# Patient Record
Sex: Male | Born: 1975 | Race: Asian | Hispanic: No | Marital: Married | Smoking: Never smoker
Health system: Southern US, Community
[De-identification: ages and names within clinical notes are randomized; demographics above are authoritative.]

## PROBLEM LIST (undated history)

## (undated) DIAGNOSIS — R778 Other specified abnormalities of plasma proteins: Secondary | ICD-10-CM

## (undated) DIAGNOSIS — R0789 Other chest pain: Secondary | ICD-10-CM

## (undated) DIAGNOSIS — R7989 Other specified abnormal findings of blood chemistry: Secondary | ICD-10-CM

## (undated) DIAGNOSIS — I10 Essential (primary) hypertension: Secondary | ICD-10-CM

## (undated) DIAGNOSIS — I422 Other hypertrophic cardiomyopathy: Principal | ICD-10-CM

## (undated) HISTORY — PX: OTHER SURGICAL HISTORY: SHX169

## (undated) HISTORY — DX: Other chest pain: R07.89

## (undated) HISTORY — DX: Other specified abnormal findings of blood chemistry: R79.89

## (undated) HISTORY — DX: Other hypertrophic cardiomyopathy: I42.2

## (undated) HISTORY — DX: Other specified abnormalities of plasma proteins: R77.8

---

## 2017-02-04 ENCOUNTER — Encounter (HOSPITAL_BASED_OUTPATIENT_CLINIC_OR_DEPARTMENT_OTHER): Payer: Self-pay | Admitting: Emergency Medicine

## 2017-02-04 ENCOUNTER — Emergency Department (HOSPITAL_BASED_OUTPATIENT_CLINIC_OR_DEPARTMENT_OTHER)
Admission: EM | Admit: 2017-02-04 | Discharge: 2017-02-04 | Disposition: A | Discharge status: Home / Self Care | Payer: 59 | Attending: Physician Assistant | Admitting: Physician Assistant

## 2017-02-04 DIAGNOSIS — W540XXA Bitten by dog, initial encounter: Secondary | ICD-10-CM | POA: Insufficient documentation

## 2017-02-04 DIAGNOSIS — Y999 Unspecified external cause status: Secondary | ICD-10-CM | POA: Insufficient documentation

## 2017-02-04 DIAGNOSIS — Y9389 Activity, other specified: Secondary | ICD-10-CM | POA: Insufficient documentation

## 2017-02-04 DIAGNOSIS — Y9289 Other specified places as the place of occurrence of the external cause: Secondary | ICD-10-CM | POA: Diagnosis not present

## 2017-02-04 DIAGNOSIS — S71152A Open bite, left thigh, initial encounter: Secondary | ICD-10-CM | POA: Insufficient documentation

## 2017-02-04 MED ORDER — BACITRACIN ZINC 500 UNIT/GM EX OINT
TOPICAL_OINTMENT | Freq: Two times a day (BID) | CUTANEOUS | Status: DC
  Administered 2017-02-04: 22:00:00 via TOPICAL
  Filled 2017-02-04: qty 28.35

## 2017-02-04 MED ORDER — AMOXICILLIN-POT CLAVULANATE 875-125 MG PO TABS
1.0000 | ORAL_TABLET | Freq: Once | ORAL | Status: AC
  Administered 2017-02-04: 1 via ORAL
  Filled 2017-02-04: qty 1

## 2017-02-04 MED ORDER — AMOXICILLIN-POT CLAVULANATE 875-125 MG PO TABS: 1.0000 | ORAL_TABLET | Freq: Two times a day (BID) | ORAL | 0 refills | Status: DC

## 2017-02-04 NOTE — Discharge Instructions (Signed)
Please retuen with any concerns.

## 2017-02-04 NOTE — ED Triage Notes (Signed)
Patient states that he was walking in the neighborhood dog. The patient reports that he was bit by the dog to his left thigh  - patient denies any pain, dogs shots were up to date

## 2017-02-04 NOTE — ED Provider Notes (Signed)
[facility]   CSN: [id number] Arrival date & time: 02/04/17  2002     History   Chief Complaint Chief Complaint  Patient presents with  . Animal Bite    HPI Patrick Santana is a 41 y.o. male.  HPI   Patient is a 41 year old male presenting with bite to left thigh. Patient has dog records and.is up-to-date. Appear to be 3 small marks, abrasion versus bite. Nothing to repair. Patient otherwise appears baseline.  History reviewed. No pertinent past medical history.  There are no active problems to display for this patient.   History reviewed. No pertinent surgical history.     Home Medications    Prior to Admission medications   Not on File    Family History History reviewed. No pertinent family history.  Social History Social History  Substance Use Topics  . Smoking status: Never Smoker  . Smokeless tobacco: Never Used  . Alcohol use Yes     Allergies   Patient has no known allergies.   Review of Systems Review of Systems  Respiratory: Negative for shortness of breath.   Cardiovascular: Negative for chest pain.  Neurological: Negative for weakness.  All other systems reviewed and are negative.    Physical Exam Updated Vital Signs BP (!) 145/103 (BP Location: Left Arm)   Pulse 80   Temp 98 F (36.7 C) (Oral)   Resp 20   Ht 5\' 10"  (1.778 m)   Wt 73 kg (160 lb 15 oz)   SpO2 98%   BMI 23.09 kg/m   Physical Exam  Constitutional: He is oriented to person, place, and time. He appears well-nourished.  HENT:  Head: Normocephalic.  Mouth/Throat: Oropharynx is clear and moist.  Eyes: Conjunctivae are normal.  Cardiovascular: Normal rate.   Pulmonary/Chest: Effort normal. No respiratory distress.  Musculoskeletal: Normal range of motion.  3 small abrasions/puncture wounds to the left thigh. No need for repair. No surrounding erythema.  Neurological: He is oriented to person, place, and time.  Skin: Skin is warm  and dry. No rash noted. He is not diaphoretic.  Psychiatric: He has a normal mood and affect. His behavior is normal.  Nursing note and vitals reviewed.    ED Treatments / Results  Labs (all labs ordered are listed, but only abnormal results are displayed) Labs Reviewed - No data to display  EKG  EKG Interpretation None       Radiology No results found.  Procedures Procedures (including critical care time)  Medications Ordered in ED Medications  amoxicillin-clavulanate (AUGMENTIN) 875-125 MG per tablet 1 tablet (not administered)  bacitracin ointment (not administered)     Initial Impression / Assessment and Plan / ED Course  I have reviewed the triage vital signs and the nursing notes.  Pertinent labs & imaging results that were available during my care of the patient were reviewed by me and considered in my medical decision making (see chart for details).     Patient is a 41 year old male presenting with bite to left thigh. Patient has dog records and.is up-to-date. Appear to be 3 small marks, abrasion versus bite. Nothing to repair. Patient otherwise appears baseline. We'll choose Augmentin. We'll have patient soak in bath tub when he gets home and then put on bacitracin. Neosporin on the abrasions. Return precautions expressed.  Final Clinical Impressions(s) / ED Diagnoses   Final diagnoses:  None    New Prescriptions New Prescriptions   No medications on file  Abelino DerrickMackuen, Shawanda Sievert Lyn, MD 02/04/17 2159

## 2017-08-24 DIAGNOSIS — Z3141 Encounter for fertility testing: Secondary | ICD-10-CM | POA: Diagnosis not present

## 2017-10-27 DIAGNOSIS — Z3141 Encounter for fertility testing: Secondary | ICD-10-CM | POA: Diagnosis not present

## 2017-11-21 DIAGNOSIS — Z3141 Encounter for fertility testing: Secondary | ICD-10-CM | POA: Diagnosis not present

## 2017-12-06 DIAGNOSIS — Z3141 Encounter for fertility testing: Secondary | ICD-10-CM | POA: Diagnosis not present

## 2018-01-29 ENCOUNTER — Other Ambulatory Visit: Payer: Self-pay

## 2018-01-29 ENCOUNTER — Encounter (HOSPITAL_BASED_OUTPATIENT_CLINIC_OR_DEPARTMENT_OTHER): Payer: Self-pay | Admitting: Emergency Medicine

## 2018-01-29 ENCOUNTER — Observation Stay (HOSPITAL_BASED_OUTPATIENT_CLINIC_OR_DEPARTMENT_OTHER)
Admission: EM | Admit: 2018-01-29 | Discharge: 2018-02-01 | Disposition: A | Discharge status: Home / Self Care | Payer: 59 | Attending: Internal Medicine | Admitting: Internal Medicine

## 2018-01-29 ENCOUNTER — Emergency Department (HOSPITAL_BASED_OUTPATIENT_CLINIC_OR_DEPARTMENT_OTHER): Payer: 59

## 2018-01-29 DIAGNOSIS — Z79899 Other long term (current) drug therapy: Secondary | ICD-10-CM | POA: Insufficient documentation

## 2018-01-29 DIAGNOSIS — I517 Cardiomegaly: Secondary | ICD-10-CM | POA: Diagnosis not present

## 2018-01-29 DIAGNOSIS — R200 Anesthesia of skin: Secondary | ICD-10-CM | POA: Diagnosis present

## 2018-01-29 DIAGNOSIS — M542 Cervicalgia: Secondary | ICD-10-CM | POA: Insufficient documentation

## 2018-01-29 DIAGNOSIS — R0789 Other chest pain: Secondary | ICD-10-CM | POA: Diagnosis not present

## 2018-01-29 DIAGNOSIS — R778 Other specified abnormalities of plasma proteins: Secondary | ICD-10-CM | POA: Diagnosis present

## 2018-01-29 DIAGNOSIS — I10 Essential (primary) hypertension: Secondary | ICD-10-CM | POA: Diagnosis present

## 2018-01-29 DIAGNOSIS — R079 Chest pain, unspecified: Secondary | ICD-10-CM | POA: Diagnosis present

## 2018-01-29 DIAGNOSIS — E785 Hyperlipidemia, unspecified: Secondary | ICD-10-CM | POA: Diagnosis not present

## 2018-01-29 DIAGNOSIS — I160 Hypertensive urgency: Secondary | ICD-10-CM | POA: Diagnosis not present

## 2018-01-29 DIAGNOSIS — R7989 Other specified abnormal findings of blood chemistry: Secondary | ICD-10-CM | POA: Diagnosis present

## 2018-01-29 DIAGNOSIS — E876 Hypokalemia: Secondary | ICD-10-CM | POA: Diagnosis present

## 2018-01-29 HISTORY — DX: Essential (primary) hypertension: I10

## 2018-01-29 LAB — BASIC METABOLIC PANEL
ANION GAP: 11 (ref 5–15)
BUN: 10 mg/dL (ref 6–20)
CHLORIDE: 100 mmol/L (ref 98–111)
CO2: 26 mmol/L (ref 22–32)
Calcium: 9.3 mg/dL (ref 8.9–10.3)
Creatinine, Ser: 0.92 mg/dL (ref 0.61–1.24)
GFR calc Af Amer: >60 mL/min (ref >60)
GLUCOSE: 119 mg/dL — AB (ref 70–99)
Potassium: 3.1 mmol/L — ABNORMAL LOW (ref 3.5–5.1)
Sodium: 137 mmol/L (ref 135–145)

## 2018-01-29 LAB — TROPONIN I: TROPONIN I: 0.03 ng/mL — AB (ref <0.03)

## 2018-01-29 LAB — CBC WITH DIFFERENTIAL/PLATELET
BASOS ABS: 0 10*3/uL (ref 0.0–0.1)
BASOS PCT: 0 %
Eosinophils Absolute: 0 10*3/uL (ref 0.0–0.7)
Eosinophils Relative: 0 %
HEMATOCRIT: 41 % (ref 39.0–52.0)
Hemoglobin: 14.6 g/dL (ref 13.0–17.0)
LYMPHS PCT: 21 %
Lymphs Abs: 1.9 10*3/uL (ref 0.7–4.0)
MCH: 34 pg (ref 26.0–34.0)
MCHC: 35.6 g/dL (ref 30.0–36.0)
MCV: 95.3 fL (ref 78.0–100.0)
MONOS PCT: 7 %
Monocytes Absolute: 0.6 10*3/uL (ref 0.1–1.0)
Neutro Abs: 6.4 10*3/uL (ref 1.7–7.7)
Neutrophils Relative %: 72 %
Platelets: 236 10*3/uL (ref 150–400)
RBC: 4.3 MIL/uL (ref 4.22–5.81)
RDW: 12.7 % (ref 11.5–15.5)
WBC: 8.9 10*3/uL (ref 4.0–10.5)

## 2018-01-29 MED ORDER — POTASSIUM CHLORIDE CRYS ER 20 MEQ PO TBCR
40.0000 meq | EXTENDED_RELEASE_TABLET | Freq: Once | ORAL | Status: AC
  Administered 2018-01-29: 40 meq via ORAL
  Filled 2018-01-29: qty 2

## 2018-01-29 MED ORDER — NITROGLYCERIN 0.4 MG SL SUBL
0.4000 mg | SUBLINGUAL_TABLET | SUBLINGUAL | Status: DC | PRN
  Administered 2018-01-29 (×2): 0.4 mg via SUBLINGUAL
  Filled 2018-01-29: qty 1

## 2018-01-29 MED ORDER — MORPHINE SULFATE (PF) 2 MG/ML IV SOLN: 2.0000 mg | INTRAVENOUS | Status: DC | PRN

## 2018-01-29 NOTE — ED Triage Notes (Signed)
Pt c/o 5/10 left side cp with numbness on his left arm, denies any nausea, vomiting or dizziness for the past 2 days.

## 2018-01-29 NOTE — ED Notes (Signed)
Family at bedside. 

## 2018-01-29 NOTE — ED Provider Notes (Signed)
[facility] EMERGENCY DEPARTMENT Provider Note   CSN: [id number] Arrival date & time: 01/29/18  2015     History   Chief Complaint Chief Complaint  Patient presents with  . Chest Pain    HPI Patrick Santana is a 42 y.o. male.  HPI  42 year old male presents with chest pain and left arm numbness.  Patient states that he started having the symptoms yesterday and they seem to be coming and going.  At first he will have a little bit of tingling in his left upper arm with some chest pressure to his left chest.  Comes and goes and never seem to last more than an hour.  Often happens at rest and he has not noticed it while exerting himself.  He went on a hike yesterday and it felt fine.  Seems to still be coming and going and so he went to urgent care to get it checked out.  There his blood pressure was noted to be high and he was told to come to the ER.  The numbness also started coming back.  He is having currently about a 2 out of 10 left-sided chest pressure.  There is no shortness of breath, dizziness, headache, or weakness in his extremities.  No nausea or vomiting.  He denies any known medical problems.  He states he did see a urologist a few months ago and was told his blood pressure was elevated then it was about 160 systolic.  History reviewed. No pertinent past medical history.  Patient Active Problem List   Diagnosis Date Noted  . Chest pain 01/29/2018  . Elevated troponin 01/29/2018  . Hypokalemia 01/29/2018  . Hypertensive urgency 01/29/2018    History reviewed. No pertinent surgical history.      Home Medications    Prior to Admission medications   Medication Sig Start Date End Date Taking? Authorizing Provider  amoxicillin-clavulanate (AUGMENTIN) 875-125 MG tablet Take 1 tablet by mouth every 12 (twelve) hours. 02/04/17   Mackuen, Cindee Salt, MD    Family History History reviewed. No pertinent family history.  Social History Social History    Tobacco Use  . Smoking status: Never Smoker  . Smokeless tobacco: Never Used  Substance Use Topics  . Alcohol use: Yes  . Drug use: No     Allergies   Patient has no known allergies.   Review of Systems Review of Systems  Respiratory: Negative for shortness of breath.   Cardiovascular: Positive for chest pain.  Gastrointestinal: Negative for abdominal pain, nausea and vomiting.  Musculoskeletal: Negative for back pain.  Neurological: Positive for numbness. Negative for dizziness, weakness and headaches.  All other systems reviewed and are negative.    Physical Exam Updated Vital Signs BP (!) 159/108   Pulse 65   Temp 97.9 F (36.6 C) (Oral)   Resp 14   Ht 5' 10.08" (1.78 m)   Wt 70.3 kg (155 lb) Comment: weighed at urgent care  SpO2 100%   BMI 22.19 kg/m   Physical Exam  Constitutional: He is oriented to person, place, and time. He appears well-developed and well-nourished.  Non-toxic appearance. He does not appear ill. No distress.  HENT:  Head: Normocephalic and atraumatic.  Right Ear: External ear normal.  Left Ear: External ear normal.  Nose: Nose normal.  Eyes: Right eye exhibits no discharge. Left eye exhibits no discharge.  Neck: Neck supple.  Cardiovascular: Normal rate, regular rhythm and normal heart sounds.  Pulses:  Radial pulses are 2+ on the right side, and 1+ on the left side.  Pulmonary/Chest: Effort normal and breath sounds normal.  Abdominal: Soft. There is no tenderness.  Musculoskeletal: He exhibits no edema.  Neurological: He is alert and oriented to person, place, and time.  5/5 strength in all 4 extremities. Normal gross sensation, including to left arm  Skin: Skin is warm and dry.  Nursing note and vitals reviewed.    ED Treatments / Results  Labs (all labs ordered are listed, but only abnormal results are displayed) Labs Reviewed  BASIC METABOLIC PANEL - Abnormal; Notable for the following components:      Result Value    Potassium 3.1 (*)    Glucose, Bld 119 (*)    All other components within normal limits  TROPONIN I - Abnormal; Notable for the following components:   Troponin I 0.03 (*)    All other components within normal limits  CBC WITH DIFFERENTIAL/PLATELET    EKG EKG Interpretation  Date/Time:  Monday January 29 2018 20:23:32 EDT Ventricular Rate:  66 PR Interval:    QRS Duration: 90 QT Interval:  433 QTC Calculation: 454 R Axis:   56 Text Interpretation:  Sinus rhythm LVH with secondary repolarization abnormality Anterior ST elevation, probably due to LVH Baseline wander in lead(s) V2 No old tracing to compare Confirmed by Pricilla Loveless [phone]) on 01/29/2018 8:27:10 PM   EKG Interpretation  Date/Time:  Monday January 29 2018 21:11:44 EDT Ventricular Rate:  81 PR Interval:    QRS Duration: 88 QT Interval:  385 QTC Calculation: 447 R Axis:   51 Text Interpretation:  Sinus rhythm Consider left atrial enlargement LVH with secondary repolarization abnormality Anterior ST elevation, probably due to LVH Artifact Confirmed by Pricilla Loveless [phone]) on 01/29/2018 10:00:09 PM        Radiology Dg Chest 2 View  Result Date: 01/29/2018 CLINICAL DATA:  Left arm numbness with left-sided chest pain for the past 2 days. EXAM: CHEST - 2 VIEW COMPARISON:  None. FINDINGS: The heart size and mediastinal contours are within normal limits. Both lungs are clear. The visualized skeletal structures are unremarkable. IMPRESSION: No active cardiopulmonary disease. Electronically Signed   By: Tollie Eth M.D.   On: 01/29/2018 21:08    Procedures .Critical Care Performed by: Pricilla Loveless, MD Authorized by: Pricilla Loveless, MD   Critical care provider statement:    Critical care time (minutes):  30   Critical care time was exclusive of:  Separately billable procedures and treating other patients   Critical care was necessary to treat or prevent imminent or life-threatening deterioration of the following  conditions:  Cardiac failure   Critical care was time spent personally by me on the following activities:  Development of treatment plan with patient or surrogate, discussions with consultants, evaluation of patient's response to treatment, examination of patient, obtaining history from patient or surrogate, ordering and performing treatments and interventions, ordering and review of laboratory studies, ordering and review of radiographic studies, pulse oximetry, re-evaluation of patient's condition and review of old charts   (including critical care time)  Medications Ordered in ED Medications  nitroGLYCERIN (NITROSTAT) SL tablet 0.4 mg (0.4 mg Sublingual Given 01/29/18 2058)  morphine 2 MG/ML injection 2 mg (has no administration in time range)  potassium chloride SA (K-DUR,KLOR-CON) CR tablet 40 mEq (40 mEq Oral Given 01/29/18 2119)     Initial Impression / Assessment and Plan / ED Course  I have reviewed the triage vital signs  and the nursing notes.  Pertinent labs & imaging results that were available during my care of the patient were reviewed by me and considered in my medical decision making (see chart for details).  Clinical Course as of Jan 29 2353  Mon Jan 29, 2018  2042 Discussed with Dr. Carlynn HeraldWarriach. Concerning ECG, but probably has LVH component. Recommends serial ECGs and call back when first troponin is back. Do not call code STEMI at this time. Patient has already received ASA at urgent care   [SG]    Clinical Course User Index [SG] Pricilla LovelessGoldston, Braylyn Eye, MD    Patient is quite hypertensive on arrival but actually looks pretty good and has minimal chest pain.  ECG is quite abnormal but this is probably LVH.  I discussed with cardiology fellow who agrees but would like serial ECGs and to call back when troponin comes back.  He has previously been given 4 baby aspirin by urgent care just prior to arrival.  His blood pressure has improved with nitroglycerin and he now is pain-free.  ECGs  are unchanging.  Discussed with cardiology fellow again as well as Dr. Gala RomneyBensimhon who advises hospitalist admit and echo with cards consult in morning.  Continue to cycle troponins.  Discussed with Dr. Clyde LundborgNiu who will admit.  At this point cardiology asked to hold off on heparin until further troponins.  Troponin leak is minimal to the point that it could just be from hypertension and LVH. Has remained stable, transferred via [facility]link.  Final Clinical Impressions(s) / ED Diagnoses   Final diagnoses:  Atypical chest pain  Elevated troponin    ED Discharge Orders    None       Pricilla LovelessGoldston, Dakarai Mcglocklin, MD 01/29/18 2357

## 2018-01-29 NOTE — ED Notes (Signed)
ED Provider at bedside. 

## 2018-01-29 NOTE — ED Notes (Signed)
Date and time results received: 01/29/18 2008 (use smartphrase ".now" to insert current time)  Test: troponin Critical Value: 0.03  Name of Provider Notified: Dr. Criss AlvineGoldston  Orders Received? Or Actions Taken?: awaiting new orders.

## 2018-01-29 NOTE — ED Notes (Signed)
Report attempted x 1

## 2018-01-29 NOTE — Care Management (Signed)
This is a no charge note  [facility] Lawrence MarseillesGoldstone  42 year old male with past medical history of possible hypertension not medications, who presents with intermittent chest pain for 2 days.  He also has left arm numbness.  Patient was found to have positive troponin 0.03, EKG showed deep T wave inversion in lateral leads and in V3-V6, bifascicular T wave in aVF and V3. EDP dicussed with cardiology, Dr. Rosemary HolmsPatwardhan, who recommended to get 2D echo and cycle trop. Need to consult card in AM again.   WBC 8.9, troponin 0.03, potassium 3.1, creatinine normal, temperature normal, elevated blood pressure 208/115-->175/112, HRn 105-->76, RR 22-->76, oxygen saturation 100%, temperature normal, negative chest x-ray.  Patient is placed on telemetry bed for observation.  Please call manager of [facility] at [phone] when pt arrives to floor   Lorretta HarpXilin Fouad Taul, MD  [facility] Pager [phone]  If 7PM-7AM, please contact night-coverage [email or web address] Password [email or web address] 01/29/2018, 9:44 PM

## 2018-01-30 ENCOUNTER — Observation Stay (HOSPITAL_COMMUNITY): Payer: 59

## 2018-01-30 ENCOUNTER — Encounter (HOSPITAL_COMMUNITY): Payer: Self-pay | Admitting: Internal Medicine

## 2018-01-30 ENCOUNTER — Observation Stay (HOSPITAL_BASED_OUTPATIENT_CLINIC_OR_DEPARTMENT_OTHER): Payer: 59

## 2018-01-30 DIAGNOSIS — I160 Hypertensive urgency: Secondary | ICD-10-CM | POA: Diagnosis not present

## 2018-01-30 DIAGNOSIS — R748 Abnormal levels of other serum enzymes: Secondary | ICD-10-CM

## 2018-01-30 DIAGNOSIS — E876 Hypokalemia: Secondary | ICD-10-CM | POA: Diagnosis not present

## 2018-01-30 DIAGNOSIS — E785 Hyperlipidemia, unspecified: Secondary | ICD-10-CM | POA: Diagnosis not present

## 2018-01-30 DIAGNOSIS — R079 Chest pain, unspecified: Secondary | ICD-10-CM

## 2018-01-30 DIAGNOSIS — R200 Anesthesia of skin: Secondary | ICD-10-CM | POA: Diagnosis present

## 2018-01-30 DIAGNOSIS — M542 Cervicalgia: Secondary | ICD-10-CM | POA: Diagnosis not present

## 2018-01-30 DIAGNOSIS — I10 Essential (primary) hypertension: Secondary | ICD-10-CM | POA: Diagnosis not present

## 2018-01-30 DIAGNOSIS — I517 Cardiomegaly: Secondary | ICD-10-CM | POA: Diagnosis not present

## 2018-01-30 DIAGNOSIS — M50122 Cervical disc disorder at C5-C6 level with radiculopathy: Secondary | ICD-10-CM | POA: Diagnosis not present

## 2018-01-30 DIAGNOSIS — I422 Other hypertrophic cardiomyopathy: Secondary | ICD-10-CM | POA: Diagnosis not present

## 2018-01-30 LAB — TROPONIN I
Troponin I: <0.03 ng/mL (ref <0.03)
Troponin I: <0.03 ng/mL (ref <0.03)
Troponin I: <0.03 ng/mL (ref <0.03)

## 2018-01-30 LAB — HEMOGLOBIN A1C
HEMOGLOBIN A1C: 5.1 % (ref 4.8–5.6)
MEAN PLASMA GLUCOSE: 99.67 mg/dL

## 2018-01-30 LAB — RAPID URINE DRUG SCREEN, HOSP PERFORMED
AMPHETAMINES: NOT DETECTED
BARBITURATES: NOT DETECTED
BENZODIAZEPINES: NOT DETECTED
Cocaine: NOT DETECTED
Opiates: NOT DETECTED
Tetrahydrocannabinol: NOT DETECTED

## 2018-01-30 LAB — LIPID PANEL
Cholesterol: 265 mg/dL — ABNORMAL HIGH (ref 0–200)
HDL: 75 mg/dL (ref >40)
LDL CALC: 156 mg/dL — AB (ref 0–99)
Total CHOL/HDL Ratio: 3.5 RATIO
Triglycerides: 169 mg/dL — ABNORMAL HIGH (ref <150)
VLDL: 34 mg/dL (ref 0–40)

## 2018-01-30 LAB — MAGNESIUM: Magnesium: 2.1 mg/dL (ref 1.7–2.4)

## 2018-01-30 LAB — HIV ANTIBODY (ROUTINE TESTING W REFLEX): HIV SCREEN 4TH GENERATION: NONREACTIVE

## 2018-01-30 MED ORDER — ZOLPIDEM TARTRATE 5 MG PO TABS: 5.0000 mg | ORAL_TABLET | Freq: Every evening | ORAL | Status: DC | PRN

## 2018-01-30 MED ORDER — ATORVASTATIN CALCIUM 40 MG PO TABS
40.0000 mg | ORAL_TABLET | Freq: Every day | ORAL | Status: DC
  Administered 2018-01-30 – 2018-01-31 (×2): 40 mg via ORAL
  Filled 2018-01-30 (×2): qty 1

## 2018-01-30 MED ORDER — ASPIRIN EC 325 MG PO TBEC
325.0000 mg | DELAYED_RELEASE_TABLET | Freq: Every day | ORAL | Status: DC
  Administered 2018-01-30 – 2018-01-31 (×2): 325 mg via ORAL
  Filled 2018-01-30 (×2): qty 1

## 2018-01-30 MED ORDER — IOPAMIDOL (ISOVUE-370) INJECTION 76%
100.0000 mL | Freq: Once | INTRAVENOUS | Status: AC | PRN
  Administered 2018-01-30: 100 mL via INTRAVENOUS

## 2018-01-30 MED ORDER — ACETAMINOPHEN 325 MG PO TABS
650.0000 mg | ORAL_TABLET | Freq: Four times a day (QID) | ORAL | Status: DC | PRN
  Administered 2018-01-30: 650 mg via ORAL
  Filled 2018-01-30: qty 2

## 2018-01-30 MED ORDER — ONDANSETRON HCL 4 MG/2ML IJ SOLN: 4.0000 mg | Freq: Three times a day (TID) | INTRAMUSCULAR | Status: DC | PRN

## 2018-01-30 MED ORDER — AMLODIPINE BESYLATE 5 MG PO TABS
5.0000 mg | ORAL_TABLET | Freq: Every day | ORAL | Status: DC
  Administered 2018-01-30 – 2018-01-31 (×3): 5 mg via ORAL
  Filled 2018-01-30 (×3): qty 1

## 2018-01-30 MED ORDER — IOPAMIDOL (ISOVUE-370) INJECTION 76%
INTRAVENOUS | Status: AC
  Filled 2018-01-30: qty 100

## 2018-01-30 MED ORDER — HYDRALAZINE HCL 20 MG/ML IJ SOLN: 5.0000 mg | INTRAMUSCULAR | Status: DC | PRN

## 2018-01-30 MED ORDER — CARVEDILOL 12.5 MG PO TABS
12.5000 mg | ORAL_TABLET | Freq: Two times a day (BID) | ORAL | Status: DC
  Administered 2018-01-30 (×2): 12.5 mg via ORAL
  Filled 2018-01-30 (×2): qty 1

## 2018-01-30 MED ORDER — ENOXAPARIN SODIUM 40 MG/0.4ML ~~LOC~~ SOLN
40.0000 mg | SUBCUTANEOUS | Status: DC
  Administered 2018-01-30: 40 mg via SUBCUTANEOUS
  Filled 2018-01-30: qty 0.4

## 2018-01-30 NOTE — Consult Note (Addendum)
Cardiology Consultation:   Patient ID: Patrick Santana; [id number]; Aug 02, 1975   Admit date: 01/29/2018 Date of Consult: 01/30/2018  Primary Care Provider: Patient, No Pcp Per Primary Cardiologist: New   Patient Profile:   Patrick Santana is a 42 y.o. male with a hx of hypertension (not on medications) who is being seen today for the evaluation of chest pain at the request of Dr. Clyde LundborgNiu.  History of Present Illness:   Mr. Patrick Santana is a 42 year old male with a history stated above who initially presented to [facility] on [facility]/29/2018 with complaints of intermittent chest pain x2 days with associated left arm numbness. He reports prior to Sunday, he was in his usual state of health. On Sunday morning he began to feel left arm tingling with mild anterior chest pain rated a 2/10 in severity not associated with exertion. He proceeded with his wife to go on a hike and continued to feel the tingling and numbness however, it was tolerable and did not worsen with excercise. On Monday morning he went to work as usual and again began to feel his symptoms. Given the ongoing nature, he proceeded to an urgent care in which they performed an EKG and sent him to [facility] for further evaluation.  It was noted he was markedly hypertensive at the urgent care center with SBP initially in the 200s.  He denies palpitations, LE swelling, dyspnea, dizziness, headache, crushing/severe chest pain or syncope.  The pain is not worse with inspiration however, is somewhat worse with drinking water.  He does report that while in the ED his pain was relieved by SL NTG.  He has not had recurrent pain since admission.  He is resting comfortably in his room.  He denies tobacco, alcohol or illicit drug use.  He is no personal or family history of CAD.  He reports a family history of hypertension and diabetes.  He works as a Sport and exercise psychologistsoftware engineer.  In the ED, initial EKG performed revealed NSR with HR 66 and diffuse deep  T waves.  Initial i-STAT troponin 0.03 subsequent troponin I negative at <0.03.  Lipid panel revealed elevated LDL at 156.  Potassium was noted to be mildly low at 3.1.  CXR completed with no acute cardiopulmonary disease.  Significant abnormal EKG, cardiology was consulted for further management.  Past Medical History:  Diagnosis Date  . Essential hypertension     Past Surgical History:  Procedure Laterality Date  . left ear drum       Prior to Admission medications   Medication Sig Start Date End Date Taking? Authorizing Provider  amoxicillin-clavulanate (AUGMENTIN) 875-125 MG tablet Take 1 tablet by mouth every 12 (twelve) hours. 02/04/17   Mackuen, Cindee Saltourteney Lyn, MD    Inpatient Medications: Scheduled Meds: . amLODipine  5 mg Oral Daily  . aspirin EC  325 mg Oral Daily  . enoxaparin (LOVENOX) injection  40 mg Subcutaneous Q24H   Continuous Infusions:  PRN Meds: acetaminophen, hydrALAZINE, morphine injection, nitroGLYCERIN, ondansetron (ZOFRAN) IV, zolpidem  Allergies:   No Known Allergies  Social History:   Social History   Socioeconomic History  . Marital status: Married    Spouse name: Not on file  . Number of children: Not on file  . Years of education: Not on file  . Highest education level: Not on file  Occupational History  . Not on file  Social Needs  . Financial resource strain: Not on file  . Food insecurity:    Worry: Not  on file    Inability: Not on file  . Transportation needs:    Medical: Not on file    Non-medical: Not on file  Tobacco Use  . Smoking status: Never Smoker  . Smokeless tobacco: Never Used  Substance and Sexual Activity  . Alcohol use: Yes  . Drug use: No  . Sexual activity: Not on file  Lifestyle  . Physical activity:    Days per week: Not on file    Minutes per session: Not on file  . Stress: Not on file  Relationships  . Social connections:    Talks on phone: Not on file    Gets together: Not on file    Attends  religious service: Not on file    Active member of club or organization: Not on file    Attends meetings of clubs or organizations: Not on file    Relationship status: Not on file  . Intimate partner violence:    Fear of current or ex partner: Not on file    Emotionally abused: Not on file    Physically abused: Not on file    Forced sexual activity: Not on file  Other Topics Concern  . Not on file  Social History Narrative  . Not on file    Family History:   Family History  Problem Relation Age of Onset  . Diabetes Mellitus II Mother   . Diabetes Mellitus II Father    Family Status:  Family Status  Relation Name Status  . Mother  (Not Specified)  . Father  (Not Specified)    ROS:  Please see the history of present illness.  All other ROS reviewed and negative.     Physical Exam/Data:   Vitals:   01/30/18 0005 01/30/18 0016 01/30/18 0315 01/30/18 0508  BP:  (!) 160/118 (!) 154/108 (!) 152/109  Pulse:  72  (!) 59  Resp:    20  Temp:  97.8 F (36.6 C)  (!) 97.4 F (36.3 C)  TempSrc:  Oral  Oral  SpO2:  100%  100%  Weight: 153 lb (69.4 kg)     Height: 5\' 10"  (1.778 m)       Intake/Output Summary (Last 24 hours) at 01/30/2018 0744 Last data filed at 01/30/2018 0030 Gross per 24 hour  Intake -  Output 425 ml  Net -425 ml   Filed Weights   01/29/18 2018 01/30/18 0005  Weight: 155 lb (70.3 kg) 153 lb (69.4 kg)   Body mass index is 21.95 kg/m.   General: Well developed, well nourished, NAD Skin: Warm, dry, intact  Head: Normocephalic, atraumatic, clear, moist mucus membranes. Neck: Negative for carotid bruits. No JVD Lungs:Clear to ausculation bilaterally. No wheezes, rales, or rhonchi. Breathing is unlabored. Cardiovascular: RRR with S1 S2. No murmurs, rubs or gallops Abdomen: Soft, non-tender, non-distended with normoactive bowel sounds.  No obvious abdominal masses. MSK: Strength and tone appear normal for age. 5/5 in all extremities Extremities: No edema.  No clubbing or cyanosis. DP/PT pulses 2+ bilaterally Neuro: Alert and oriented. No focal deficits. No facial asymmetry. MAE spontaneously. Psych: Responds to questions appropriately with normal affect.    EKG:  The EKG was personally reviewed and demonstrates: 01/30/2018 NSR HR 67 with diffuse T wave inversions, evidence of LVH and questionable ST elevation in inferior leads Telemetry:  Telemetry was personally reviewed and demonstrates: Teen NSR HR 81  Relevant CV Studies:  ECHO: Pending  CATH: None  Laboratory Data:  Chemistry Recent Labs  Lab 01/29/18 2038  NA 137  K 3.1*  CL 100  CO2 26  GLUCOSE 119*  BUN 10  CREATININE 0.92  CALCIUM 9.3  GFRNONAA >60  GFRAA >60  ANIONGAP 11    No results found for: PROT, ALBUMIN, AST, ALT, ALKPHOS, BILITOT Hematology Recent Labs  Lab 01/29/18 2038  WBC 8.9  RBC 4.30  HGB 14.6  HCT 41.0  MCV 95.3  MCH 34.0  MCHC 35.6  RDW 12.7  PLT 236   Cardiac Enzymes Recent Labs  Lab 01/29/18 2038 01/30/18 0335  TROPONINI 0.03* <0.03   No results for input(s): TROPIPOC in the last 168 hours.  BNPNo results for input(s): BNP, PROBNP in the last 168 hours.  DDimer No results for input(s): DDIMER in the last 168 hours. TSH: No results found for: TSH Lipids: Lab Results  Component Value Date   CHOL 265 (H) 01/30/2018   HDL 75 01/30/2018   LDLCALC 156 (H) 01/30/2018   TRIG 169 (H) 01/30/2018   CHOLHDL 3.5 01/30/2018   HgbA1c: Lab Results  Component Value Date   HGBA1C 5.1 01/30/2018   Radiology/Studies:  Dg Chest 2 View  Result Date: 01/29/2018 CLINICAL DATA:  Left arm numbness with left-sided chest pain for the past 2 days. EXAM: CHEST - 2 VIEW COMPARISON:  None. FINDINGS: The heart size and mediastinal contours are within normal limits. Both lungs are clear. The visualized skeletal structures are unremarkable. IMPRESSION: No active cardiopulmonary disease. Electronically Signed   By: Tollie Eth M.D.   On: 01/29/2018 21:08    Assessment and Plan:   1.  Chest pain with abnormal EKG: -Patient presented from [facility] with abnormal EKG and complaints of intermittent, exertional left arm tingling with mild anterior chest pain with SL NTG (2/10).  He has no known known personal or family history of CAD.  Initial SBP noted to be markedly elevated in the 200s,which has now resolved to the 150s.  -Troponin,<0.03, continue to cycle enzymes monitor -EKG markedly abnormal with diffuse T wave inversions and new ST elevation in inferior leads -CXR negative for cardiopulmonary disease -Will obtain echocardiogram, STAT -CTA chest/aorta ordered STAT to rule out possible dissection given markedly elevated SBP on arrival and unknown duration of hypertension without control -NPO -Will likely need further ischemic evaluation depending on initial test results -ASA, BB, and will add statin   2.  Hypertension: -Elevated, 148/114, 152/109, 154/108, 160/118 -Initial BP on arrival, 208/115 -Continue amlodipine 5 mg -Will add to Carvedilol 12.5mg  twice daily and monitor BP  3.  Hypokalemia: -K+, 3.1>> replaced with 40 meq per primary team -Monitor with BMET   4. HLD: -Elevated LDL, 156 -Statin therapy with Lipitor 40mg  initially    For questions or updates, please contact [facility] Please consult [email or web address] for contact info under Cardiology/STEMI.   Raliegh Ip NP-C [facility] Pager: [phone] 01/30/2018 7:44 AM  Attending Note:   The patient was seen and examined.  Agree with assessment and plan as noted above.  Changes made to the above note as needed.  Patient seen and independently examined with  Georgie Chard, NP.   We discussed all aspects of the encounter. I agree with the assessment and plan as stated above.  1.  Chest discomfort: The patient's chest pains are somewhat atypical although are concerning for possibility of aortic dissection. He does not look like he is having  coronary ischemia. Upon her levels are negative.  He does have diffuse T wave inversions across  his EKG leads and this morning has developed a very mild amount of ST segment elevation in lead V3 and AVF.   His pain has now largely resolved.   CT angio of the chest has been completed and reveals no evidence of aortic dissection.  The CTA was normal.  He will need an ischemic work-up.  We will use the echocardiogram and CT angiogram for further information to decide how to work-up possibility of ischemic heart disease..   I have spent a total of 40 minutes with patient reviewing hospital  notes , telemetry, EKGs, labs and examining patient as well as establishing an assessment and plan that was discussed with the patient. > 50% of time was spent in direct patient care.    Vesta Mixer, Montez Hageman., MD, [facility] 01/30/2018, 10:09 AM [address]  [address] Office [phone] Pager [phone]

## 2018-01-30 NOTE — Progress Notes (Signed)
Patient admitted Dr Clyde LundborgNiu overnight for evaluation of chest pain concerning abnormal EKG.  His cardiac enzymes have remained negative, CT of the chest is negative for pulmonary embolism or dissection.  He still having off-and-on left-sided chest pain with radiation to his left arm.  He tells me he has been quite active and even played ping-pong at his work yesterday.  He denies any trauma but he did go hiking this last weekend. At this time his vital signs are stable. No new complaints.  Although his chest pain appears to be atypical we need to rule out any cardiac causes.  Plan to get an echocardiogram today and monitor him closely.  MRI cervical spine has also been ordered to rule out any radiculopathy.  Will determine further plan depending on the testing stated above.  Appreciate cardiology input.  Will closely monitor him today.  Please call with further questions  Patient's wife was present at bedside during my evaluation.  Stephania FragminAnkit Julya Alioto MD, TR[facility]

## 2018-01-30 NOTE — H&P (Signed)
History and Physical    Patrick Santana [id number] DOB: July 13, 1975 DOA: 01/29/2018  Referring MD/NP/PA:   PCP: Patient, No Pcp Per   Patient coming from:  The patient is coming from home.  At baseline, pt is independent for most of ADL.    Chief Complaint: left arm numbness and chest pain  HPI: Patrick Hertzmaresh Feltus is a [age] y.o. male with medical history significant of hypertension not on medications, who presents with chest pain, left arm numbness.  Patient states that he has been having intermittent left arm numbness in the past 2 days. It is involving the whole left arm in lateral side and whole left hand and all fingers. No left arm muscle weakness.  No facial droop, slurred speech, vision change or hearing loss.  Patient states that he has mild posterior neck pain, no neck injury.  He also reports intermittent chest pain, which is located in left side of the chest, initially 5 out of 10 severity, currently chest pain-free, sharp, nonradiating.  No tenderness in the calf areas. No shortness of breath, cough, fever or chills. Patient does not have nausea, vomiting, diarrhea, abdominal pain, symptoms of UTI.   ED Course: pt was found to have positive troponin 0.03, elevated blood pressure 208/115-->175/112, WBC 8.9, potassium 3.1, creatinine normal, temperature normal, negative chest x-ray.  Patient is placed on telemetry bed for observation.  Review of Systems:   General: no fevers, chills, no body weight gain, fatigue HEENT: no blurry vision, hearing changes or sore throat Respiratory: no dyspnea, coughing, wheezing CV: no chest pain, no palpitations GI: no nausea, vomiting, abdominal pain, diarrhea, constipation GU: no dysuria, burning on urination, increased urinary frequency, hematuria  Ext: no leg edema Neuro: has left arm numbness. No vision change or hearing loss Skin: no rash, no skin tear. MSK: No muscle spasm, no deformity, no limitation of range of movement in  spin Heme: No easy bruising.  Travel history: No recent long distant travel.  Allergy: No Known Allergies  Past Medical History:  Diagnosis Date  . Essential hypertension     Past Surgical History:  Procedure Laterality Date  . left ear drum      Social History:  reports that he has never smoked. He has never used smokeless tobacco. He reports that he drinks alcohol. He reports that he does not use drugs.  Family History:  Family History  Problem Relation Age of Onset  . Diabetes Mellitus II Mother   . Diabetes Mellitus II Father      Prior to Admission medications   Medication Sig Start Date End Date Taking? Authorizing Provider  amoxicillin-clavulanate (AUGMENTIN) 875-125 MG tablet Take 1 tablet by mouth every 12 (twelve) hours. 02/04/17   Abelino DerrickMackuen, Courteney Lyn, MD    Physical Exam: Vitals:   01/30/18 0005 01/30/18 0016 01/30/18 0315 01/30/18 0508  BP:  (!) 160/118 (!) 154/108 (!) 152/109  Pulse:  72  (!) 59  Resp:    20  Temp:  97.8 F (36.6 C)  (!) 97.4 F (36.3 C)  TempSrc:  Oral  Oral  SpO2:  100%  100%  Weight: 69.4 kg (153 lb)     Height: 5\' 10"  (1.778 m)      General: Not in acute distress HEENT:       Eyes: PERRL, EOMI, no scleral icterus.       ENT: No discharge from the ears and nose, no pharynx injection, no tonsillar enlargement.        Neck: No  JVD, no bruit, no mass felt. Heme: No neck lymph node enlargement. Cardiac: S1/S2, RRR, No murmurs, No gallops or rubs. Respiratory: No rales, wheezing, rhonchi or rubs. GI: Soft, nondistended, nontender, no rebound pain, no organomegaly, BS present. GU: No hematuria Ext: No pitting leg edema bilaterally. 2+DP/PT pulse bilaterally. Musculoskeletal: No joint deformities, No joint redness or warmth, no limitation of ROM in spin. Skin: No rashes.  Neuro: Alert, oriented X3, cranial nerves II-XII grossly intact, moves all extremities normally. Muscle strength 5/5 in all extremities, sensation to light touch  intact. Brachial reflex 2+ bilaterally. Negative Babinski's sign. Normal finger to nose test. Psych: Patient is not psychotic, no suicidal or hemocidal ideation.  Labs on Admission: I have personally reviewed following labs and imaging studies  CBC: Recent Labs  Lab 01/29/18 2038  WBC 8.9  NEUTROABS 6.4  HGB 14.6  HCT 41.0  MCV 95.3  PLT 236   Basic Metabolic Panel: Recent Labs  Lab 01/29/18 2038  NA 137  K 3.1*  CL 100  CO2 26  GLUCOSE 119*  BUN 10  CREATININE 0.92  CALCIUM 9.3   GFR: Estimated Creatinine Clearance: 104.8 mL/min (by C-G formula based on SCr of 0.92 mg/dL). Liver Function Tests: No results for input(s): AST, ALT, ALKPHOS, BILITOT, PROT, ALBUMIN in the last 168 hours. No results for input(s): LIPASE, AMYLASE in the last 168 hours. No results for input(s): AMMONIA in the last 168 hours. Coagulation Profile: No results for input(s): INR, PROTIME in the last 168 hours. Cardiac Enzymes: Recent Labs  Lab 01/29/18 2038 01/30/18 0335  TROPONINI 0.03* <0.03   BNP (last 3 results) No results for input(s): PROBNP in the last 8760 hours. HbA1C: Recent Labs    01/30/18 0335  HGBA1C 5.1   CBG: No results for input(s): GLUCAP in the last 168 hours. Lipid Profile: Recent Labs    01/30/18 0335  CHOL 265*  HDL 75  LDLCALC 156*  TRIG 169*  CHOLHDL 3.5   Thyroid Function Tests: No results for input(s): TSH, T4TOTAL, FREET4, T3FREE, THYROIDAB in the last 72 hours. Anemia Panel: No results for input(s): VITAMINB12, FOLATE, FERRITIN, TIBC, IRON, RETICCTPCT in the last 72 hours. Urine analysis: No results found for: COLORURINE, APPEARANCEUR, LABSPEC, PHURINE, GLUCOSEU, HGBUR, BILIRUBINUR, KETONESUR, PROTEINUR, UROBILINOGEN, NITRITE, LEUKOCYTESUR Sepsis Labs: @LABRCNTIP (procalcitonin:4,lacticidven:4) )No results found for this or any previous visit (from the past 240 hour(s)).   Radiological Exams on Admission: Dg Chest 2 View  Result Date:  01/29/2018 CLINICAL DATA:  Left arm numbness with left-sided chest pain for the past 2 days. EXAM: CHEST - 2 VIEW COMPARISON:  None. FINDINGS: The heart size and mediastinal contours are within normal limits. Both lungs are clear. The visualized skeletal structures are unremarkable. IMPRESSION: No active cardiopulmonary disease. Electronically Signed   By: Tollie Eth M.D.   On: 01/29/2018 21:08     EKG: Independently reviewed.  Sinus rhythm, QTC 467, LVH, deep T wave inversion in lateral leads, V3-V6, bifascicular T wave in aVF and V3   Assessment/Plan Principal Problem:   Chest pain Active Problems:   Elevated troponin   Hypokalemia   Hypertensive urgency   Left arm numbness   Chest pain and elevated trop: trop 0.03.  Most likely due to demand ischemia secondary to elevated blood pressure.  His chest pain was mild and has resolved along with improved blood pressure control.  -will place on tele bed for obs -Bp control -trop x 3 -check UDS, A1c and FLP -did not order 2d  echo. -ASA, prn NTG and prn morphine   Hypokalemia: K=3.1 on admission. - Repleted - Check Mg level  Hypertensive urgency:  bp 208/115-->175/112. Pt is not taking Bp meds at home -start amlodipine 5 mg daily -IV hydralazine as neededtart   Left arm numbness: Stroke, Etiology is not clear.  Patient does not have other signs of stroke.  No facial droop, slurred speech, vision change or hearing loss.  No muscle weakness.  low suspicions for stroke.  Patient reports neck pain, possibly due to C-spine nerve impingement. -Follow-up MRI of C-spine  DVT ppx: SQ Lovenox Code Status: Full code Family Communication: Yes, patient's wife at bed side Disposition Plan:  Anticipate discharge back to previous home environment Consults called:  none Admission status: Obs / tele    Date of Service 01/30/2018    Lorretta Harp [facility] Pager [phone]  If 7PM-7AM, please contact  night-coverage [email or web address] Password [email or web address] 01/30/2018, 8:11 AM

## 2018-01-30 NOTE — Progress Notes (Signed)
MD up to see patient. RN made MD aware of patient's BP on admission.

## 2018-01-30 NOTE — Progress Notes (Signed)
Charge nurse to page admitting that patient has arrived to floor.

## 2018-01-30 NOTE — Progress Notes (Signed)
Echocardiogram 2D Echocardiogram has been performed.  Pieter PartridgeBrooke S Lj Miyamoto 01/30/2018, 11:04 AM

## 2018-01-31 ENCOUNTER — Observation Stay (HOSPITAL_COMMUNITY): Payer: 59

## 2018-01-31 DIAGNOSIS — R0789 Other chest pain: Secondary | ICD-10-CM | POA: Diagnosis not present

## 2018-01-31 DIAGNOSIS — R748 Abnormal levels of other serum enzymes: Secondary | ICD-10-CM | POA: Diagnosis not present

## 2018-01-31 DIAGNOSIS — I422 Other hypertrophic cardiomyopathy: Secondary | ICD-10-CM | POA: Diagnosis not present

## 2018-01-31 DIAGNOSIS — I160 Hypertensive urgency: Secondary | ICD-10-CM | POA: Diagnosis not present

## 2018-01-31 DIAGNOSIS — R079 Chest pain, unspecified: Secondary | ICD-10-CM | POA: Diagnosis not present

## 2018-01-31 LAB — BASIC METABOLIC PANEL
Anion gap: 10 (ref 5–15)
BUN: 14 mg/dL (ref 6–20)
CHLORIDE: 100 mmol/L (ref 98–111)
CO2: 28 mmol/L (ref 22–32)
Calcium: 9 mg/dL (ref 8.9–10.3)
Creatinine, Ser: 0.98 mg/dL (ref 0.61–1.24)
GFR calc Af Amer: >60 mL/min (ref >60)
GFR calc non Af Amer: >60 mL/min (ref >60)
GLUCOSE: 109 mg/dL — AB (ref 70–99)
POTASSIUM: 3.8 mmol/L (ref 3.5–5.1)
SODIUM: 138 mmol/L (ref 135–145)

## 2018-01-31 LAB — ECHOCARDIOGRAM COMPLETE
Height: 70 in
Weight: 2448 oz

## 2018-01-31 MED ORDER — CARVEDILOL 25 MG PO TABS
25.0000 mg | ORAL_TABLET | Freq: Two times a day (BID) | ORAL | Status: DC
  Administered 2018-01-31 – 2018-02-01 (×3): 25 mg via ORAL
  Filled 2018-01-31 (×3): qty 1

## 2018-01-31 MED ORDER — GADOBENATE DIMEGLUMINE 529 MG/ML IV SOLN
25.0000 mL | Freq: Once | INTRAVENOUS | Status: AC
  Administered 2018-01-31: 24 mL via INTRAVENOUS

## 2018-01-31 MED ORDER — ASPIRIN EC 81 MG PO TBEC
81.0000 mg | DELAYED_RELEASE_TABLET | Freq: Every day | ORAL | Status: DC
  Administered 2018-02-01: 81 mg via ORAL
  Filled 2018-01-31: qty 1

## 2018-01-31 NOTE — Plan of Care (Signed)
Pt is without complaints of Chest pain or other discomfort. Has ambulated in halls without difficulty. Will be NPO after MN for stress test vs Cath Problem: Pain Managment: Goal: General experience of comfort will improve Outcome: Completed/Met

## 2018-01-31 NOTE — Progress Notes (Signed)
Progress Note  Patient Name: Patrick Santana Date of Encounter: 01/31/2018  Primary Cardiologist: New   Subjective   Pt feeling well today. Denies chest pain   Inpatient Medications    Scheduled Meds: . amLODipine  5 mg Oral Daily  . aspirin EC  325 mg Oral Daily  . atorvastatin  40 mg Oral q1800  . carvedilol  12.5 mg Oral BID WC  . enoxaparin (LOVENOX) injection  40 mg Subcutaneous Q24H   Continuous Infusions:  PRN Meds: acetaminophen, hydrALAZINE, morphine injection, nitroGLYCERIN, ondansetron (ZOFRAN) IV, zolpidem   Vital Signs    Vitals:   01/30/18 2055 01/31/18 0025 01/31/18 0410 01/31/18 0754  BP: (!) 140/94 116/66 (!) 132/96 (!) 155/90  Pulse: 74 61 (!) 59 68  Resp: 19 16 16 16   Temp: 98 F (36.7 C) 97.8 F (36.6 C) 97.8 F (36.6 C) 98.4 F (36.9 C)  TempSrc: Oral Oral Oral Oral  SpO2: 99% 100% 100% 100%  Weight:   152 lb 3.2 oz (69 kg)   Height:        Intake/Output Summary (Last 24 hours) at 01/31/2018 0810 Last data filed at 01/30/2018 0815 Gross per 24 hour  Intake 50 ml  Output -  Net 50 ml   Filed Weights   01/29/18 2018 01/30/18 0005 01/31/18 0410  Weight: 155 lb (70.3 kg) 153 lb (69.4 kg) 152 lb 3.2 oz (69 kg)   Physical Exam   General: Well developed, well nourished, NAD Skin: Warm, dry, intact  Head: Normocephalic, atraumatic, clear, moist mucus membranes. Neck: Negative for carotid bruits. No JVD Lungs:Clear to ausculation bilaterally. No wheezes, rales, or rhonchi. Breathing is unlabored. Cardiovascular: RRR with S1 S2. No murmurs, rubs or gallops Abdomen: Soft, non-tender, non-distended with normoactive bowel sounds. No obvious abdominal masses. MSK: Strength and tone appear normal for age. 5/5 in all extremities Extremities: No edema. No clubbing or cyanosis. DP/PT pulses 2+ bilaterally Neuro: Alert and oriented. No focal deficits. No facial asymmetry. MAE spontaneously. Psych: Responds to questions appropriately with  normal affect.    Labs    Chemistry Recent Labs  Lab 01/29/18 2038 01/31/18 0352  NA 137 138  K 3.1* 3.8  CL 100 100  CO2 26 28  GLUCOSE 119* 109*  BUN 10 14  CREATININE 0.92 0.98  CALCIUM 9.3 9.0  GFRNONAA >60 >60  GFRAA >60 >60  ANIONGAP 11 10     Hematology Recent Labs  Lab 01/29/18 2038  WBC 8.9  RBC 4.30  HGB 14.6  HCT 41.0  MCV 95.3  MCH 34.0  MCHC 35.6  RDW 12.7  PLT 236    Cardiac Enzymes Recent Labs  Lab 01/29/18 2038 01/30/18 0335 01/30/18 0818 01/30/18 1404  TROPONINI 0.03* <0.03 <0.03 <0.03   No results for input(s): TROPIPOC in the last 168 hours.   BNPNo results for input(s): BNP, PROBNP in the last 168 hours.   DDimer No results for input(s): DDIMER in the last 168 hours.   Radiology    Dg Chest 2 View  Result Date: 01/29/2018 CLINICAL DATA:  Left arm numbness with left-sided chest pain for the past 2 days. EXAM: CHEST - 2 VIEW COMPARISON:  None. FINDINGS: The heart size and mediastinal contours are within normal limits. Both lungs are clear. The visualized skeletal structures are unremarkable. IMPRESSION: No active cardiopulmonary disease. Electronically Signed   By: Tollie Eth M.D.   On: 01/29/2018 21:08   Mr Cervical Spine Wo Contrast  Result Date:  01/30/2018 CLINICAL DATA:  Radiculopathy. Pain in the left arm and left-sided chest pain. EXAM: MRI CERVICAL SPINE WITHOUT CONTRAST TECHNIQUE: Multiplanar, multisequence MR imaging of the cervical spine was performed. No intravenous contrast was administered. COMPARISON:  None. FINDINGS: Alignment: Slight straightening of the cervical lordosis. Vertebrae: No fracture, evidence of discitis, or bone lesion. Cord: Normal signal and morphology. Posterior Fossa, vertebral arteries, paraspinal tissues: Negative. Disc levels: C2-3 through C4-5: The discs are normal. No foraminal or spinal stenosis. C5-6: There is a small soft disc protrusion just to the right of midline extending into the right lateral  recess and proximal right neural foramen. No spinal cord compression. No significant foraminal stenosis. C6-7 and C7-T1: Normal. No appreciable facet arthritis in the cervical spine. Incidental note is made of a prominent epidural vein in the left lateral recesses extending from C1 through C5. This is felt to be an anatomic variant. IMPRESSION: 1. No evidence of left-sided neural impingement. 2. Small soft disc protrusion at C5-6 to the right which might affect the right C6 nerve. Electronically Signed   By: Francene Boyers M.D.   On: 01/30/2018 12:54   Ct Angio Chest Aorta W/cm &/or Wo/cm  Result Date: 01/30/2018 CLINICAL DATA:  Chest pain intermittently on LEFT rated at up to 5/10 in severity, LEFT arm numbness, history hypertension EXAM: CT ANGIOGRAPHY CHEST WITH CONTRAST TECHNIQUE: Multidetector CT imaging of the chest was performed using the standard protocol during bolus administration of intravenous contrast. Multiplanar CT image reconstructions and MIPs were obtained to evaluate the vascular anatomy. CONTRAST:  ISOVUE-370 IOPAMIDOL (ISOVUE-370) INJECTION 76% IV COMPARISON:  None FINDINGS: Cardiovascular: Aorta normal caliber without aneurysm or dissection. No atherosclerotic calcifications. Heart unremarkable. No pericardial effusion. Pulmonary arteries patent on non targeted exam without obvious filling defect. Mediastinum/Nodes: Base of cervical region normal appearance. Cough esophagus normal appearance. No thoracic adenopathy. Lungs/Pleura: Lungs clear. No infiltrate, pleural effusion, pneumothorax or mass. Upper Abdomen: Unremarkable Musculoskeletal: Normal appearance Review of the MIP images confirms the above findings. IMPRESSION: Normal CTA chest. Electronically Signed   By: Ulyses Southward M.D.   On: 01/30/2018 09:12   Telemetry    01/31/18 NSR with no acute changes - Personally Reviewed  ECG    No new tracing since 01/31/18- Personally Reviewed  Cardiac Studies   Echocardiogram  01/30/2018: Study Conclusions  - Left ventricle: The cavity size was normal. There was mild   concentric hypertrophy. Systolic function was vigorous. The   estimated ejection fraction was in the range of 65% to 70%. Wall   motion was normal; there were no regional wall motion   abnormalities. Doppler parameters are consistent with abnormal   left ventricular relaxation (grade 1 diastolic dysfunction). - Aortic valve: There was trivial regurgitation.  Patient Profile     42 y.o. male with a hx of hypertension (not on medications) who is being seen today for the evaluation of chest pain at the request of Dr. Clyde Lundborg.  Assessment & Plan    1.  Chest pain with abnormal EKG: -Patient presented from [facility] with abnormal EKG and complaints of intermittent, exertional left arm tingling with mild anterior chest pain with SL NTG (2/10). He has no known known personal or family history of CAD. Initial SBP noted to be markedly elevated in the 200s,which has now resolved to the 150s.  -Troponin,<0.03, <0.03, <0.03 -EKG markedly abnormal with diffuse T wave inversions and new ST elevation in inferior leads -CTA chest/aorta with no dissection  -Echocardiogram with  normal LVEF 65-70% with NWM and evidence of apical cardiomyopathy per MD>>will order cardiac MRI for further evaluation  -Continue ASA, BB, and will add statin   2.  Hypertension: -Elevated, 155/90>132/96>116/66>140/94 -Initial BP on arrival, 208/115 -Continue amlodipine 5 mg, carvedilol 12.5mg >>will increase to 25mg  twice daily given BP  3.  Hypokalemia: -K+, 3.8 today -Monitor with BMET   4. HLD: -Elevated LDL, 156 -Statin therapy with Lipitor 40mg  initially -Will need LFT's in 6 weeks   Signed, Georgie ChardJill McDaniel NP-C [facility] Pager: [phone] 01/31/2018, 8:10 AM     For questions or updates, please contact   Please consult [email or web address] for contact info under Cardiology/STEMI.  Attending Note:   The patient  was seen and examined.  Agree with assessment and plan as noted above.  Changes made to the above note as needed.  Patient seen and independently examined with Georgie ChardJill McDaniel. NP .   We discussed all aspects of the encounter. I agree with the assessment and plan as stated above.  1.   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 I reviewed the case with Dr. Delton SeeNelson.  We will get a cardiac MRI today.  If he does in fact have apical HCM, will refer him for genetic testing.  Continue to gradually titrate up his beta-blockers. Currently on max dose coreg.   We may change to metoprolol    I have spent a total of 40 minutes with patient reviewing hospital  notes , telemetry, EKGs, labs and examining patient as well as establishing an assessment and plan that was discussed with the patient. > 50% of time was spent in direct patient care.    Vesta MixerPhilip J. Nahser, Montez HagemanJr., MD, [facility] 01/31/2018, 10:48 AM [address],  [address] Office [phone] Pager [phone]- [phone]

## 2018-01-31 NOTE — Progress Notes (Signed)
Dr. Elease HashimotoNahser notified about patient becoming dizzy and having low blood pressure. Orders received to increase PO intake and to discontinue Norvasc.

## 2018-01-31 NOTE — Progress Notes (Addendum)
[facility] PROGRESS NOTE  Patrick Santana WUX:[id number] DOB: 1976-01-05 DOA: 01/29/2018  PCP: Patient, No Pcp Per  Brief History/Interval Summary: 42 y.o. male with medical history significant of hypertension not on medications, who presented with chest pain, left arm numbness.  Patient was noted to be mildly hypertensive.  He was hospitalized for further management.  Cardiology was consulted.  Reason for Visit: Chest pain  Consultants: Cardiology  Procedures:   Echocardiogram Study Conclusions  - Left ventricle: The cavity size was normal. There was moderate   concentric hypertrophy of the apex. The appearance was consistent   with apical variant hypertrophic cardiomyopathy. Systolic   function was vigorous. The estimated ejection fraction was in the   range of 65% to 70%. Wall motion was normal; there were no   regional wall motion abnormalities. Doppler parameters are   consistent with abnormal left ventricular relaxation (grade 1   diastolic dysfunction). - Aortic valve: There was trivial regurgitation.  Antibiotics: None  Subjective/Interval History: Denies any chest pain this morning.  He has been ambulating in the room.  Denies any shortness of breath.  ROS: Denies any nausea or vomiting  Objective:  Vital Signs  Vitals:   01/31/18 1205 01/31/18 1218 01/31/18 1222 01/31/18 1225  BP: (!) 89/73 114/79 120/72 (!) 97/55  Pulse: 73     Resp:      Temp: 98.2 F (36.8 C)     TempSrc: Oral     SpO2: 100%     Weight:      Height:        Intake/Output Summary (Last 24 hours) at 01/31/2018 1521 Last data filed at 01/31/2018 1427 Gross per 24 hour  Intake 0 ml  Output -  Net 0 ml   Filed Weights   01/29/18 2018 01/30/18 0005 01/31/18 0410  Weight: 70.3 kg (155 lb) 69.4 kg (153 lb) 69 kg (152 lb 3.2 oz)    General appearance: alert, cooperative, appears stated age and no distress Head: Normocephalic, without obvious abnormality,  atraumatic Resp: clear to auscultation bilaterally Cardio: regular rate and rhythm, S1, S2 normal, no murmur, click, rub or gallop GI: soft, non-tender; bowel sounds normal; no masses,  no organomegaly Extremities: extremities normal, atraumatic, no cyanosis or edema Neurologic: No focal neurological deficits.  Lab Results:  Data Reviewed: I have personally reviewed following labs and imaging studies  CBC: Recent Labs  Lab 01/29/18 2038  WBC 8.9  NEUTROABS 6.4  HGB 14.6  HCT 41.0  MCV 95.3  PLT 236    Basic Metabolic Panel: Recent Labs  Lab 01/29/18 2038 01/30/18 0818 01/31/18 0352  NA 137  --  138  K 3.1*  --  3.8  CL 100  --  100  CO2 26  --  28  GLUCOSE 119*  --  109*  BUN 10  --  14  CREATININE 0.92  --  0.98  CALCIUM 9.3  --  9.0  MG  --  2.1  --     GFR: Estimated Creatinine Clearance: 97.8 mL/min (by C-G formula based on SCr of 0.98 mg/dL).  Cardiac Enzymes: Recent Labs  Lab 01/29/18 2038 01/30/18 0335 01/30/18 0818 01/30/18 1404  TROPONINI 0.03* <0.03 <0.03 <0.03    HbA1C: Recent Labs    01/30/18 0335  HGBA1C 5.1    Lipid Profile: Recent Labs    01/30/18 0335  CHOL 265*  HDL 75  LDLCALC 156*  TRIG 169*  CHOLHDL 3.5     Radiology Studies: Dg  Chest 2 View  Result Date: 01/29/2018 CLINICAL DATA:  Left arm numbness with left-sided chest pain for the past 2 days. EXAM: CHEST - 2 VIEW COMPARISON:  None. FINDINGS: The heart size and mediastinal contours are within normal limits. Both lungs are clear. The visualized skeletal structures are unremarkable. IMPRESSION: No active cardiopulmonary disease. Electronically Signed   By: Tollie Eth M.D.   On: 01/29/2018 21:08   Mr Cervical Spine Wo Contrast  Result Date: 01/30/2018 CLINICAL DATA:  Radiculopathy. Pain in the left arm and left-sided chest pain. EXAM: MRI CERVICAL SPINE WITHOUT CONTRAST TECHNIQUE: Multiplanar, multisequence MR imaging of the cervical spine was performed. No intravenous  contrast was administered. COMPARISON:  None. FINDINGS: Alignment: Slight straightening of the cervical lordosis. Vertebrae: No fracture, evidence of discitis, or bone lesion. Cord: Normal signal and morphology. Posterior Fossa, vertebral arteries, paraspinal tissues: Negative. Disc levels: C2-3 through C4-5: The discs are normal. No foraminal or spinal stenosis. C5-6: There is a small soft disc protrusion just to the right of midline extending into the right lateral recess and proximal right neural foramen. No spinal cord compression. No significant foraminal stenosis. C6-7 and C7-T1: Normal. No appreciable facet arthritis in the cervical spine. Incidental note is made of a prominent epidural vein in the left lateral recesses extending from C1 through C5. This is felt to be an anatomic variant. IMPRESSION: 1. No evidence of left-sided neural impingement. 2. Small soft disc protrusion at C5-6 to the right which might affect the right C6 nerve. Electronically Signed   By: Francene Boyers M.D.   On: 01/30/2018 12:54   Mr Cardiac Morphology W Wo Contrast  Result Date: 01/31/2018 CLINICAL DATA:  42 year old male with suspicion for hypertrophic cardiomyopathy. EXAM: CARDIAC MRI TECHNIQUE: The patient was scanned on a 1.5 Tesla GE magnet. A dedicated cardiac coil was used. Functional imaging was done using Fiesta sequences. 2,3, and 4 chamber views were done to assess for RWMA's. Modified Simpson's rule using a short axis stack was used to calculate an ejection fraction on a dedicated work [employer]. The patient received cc of Multihance. After 10 minutes inversion recovery sequences were used to assess for infiltration and scar tissue. CONTRAST:  cc  of Multihance FINDINGS: 1. Normal left ventricular size, with moderate hypertrophy of the basal and mid segments and severe hypertrophy of the apical segments with hyperdynamic systolic function (LVEF = 69%). There are no regional wall motion  abnormalities. There is no late gadolinium enhancement in the left ventricular myocardium and no apical blood trapping or aneurysm. LVEDD: 43 mm LVESD: 24 mm LVEDV: 110 ml LVESV: 34 ml SV: 76 ml CO: 4.3 L/min Myocardial mass: 166 g 2. Normal right ventricular size, thickness and systolic function (LVEF = 64%). There are no regional wall motion abnormalities. 3.  Normal left and right atrial size. 4. Normal size of the aortic root, ascending aorta and pulmonary artery. 5.  No significant valvular abnormalities. 6.  Normal pericardium.  Mild circumferential pericardial effusion. IMPRESSION: 1. Normal left ventricular size, with moderate hypertrophy of the basal and mid segments and severe hypertrophy of the apical segments with hyperdynamic systolic function (LVEF = 69%). There are no regional wall motion abnormalities. There is no late gadolinium enhancement in the left ventricular myocardium and no apical blood trapping or aneurysm. 2. Normal right ventricular size, thickness and systolic function (LVEF = 64%). There are no regional wall motion abnormalities. 3.  Normal left and right atrial size. 4. Normal size of  the aortic root, ascending aorta and pulmonary artery. 5.  No significant valvular abnormalities. 6.  Normal pericardium.  Mild circumferential pericardial effusion. 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 Electronically Signed   By: Tobias AlexanderKatarina  Nelson   On: 01/31/2018 14:40   Ct Angio Chest Aorta W/cm &/or Wo/cm  Result Date: 01/30/2018 CLINICAL DATA:  Chest pain intermittently on LEFT rated at up to 5/10 in severity, LEFT arm numbness, history hypertension EXAM: CT ANGIOGRAPHY CHEST WITH CONTRAST TECHNIQUE: Multidetector CT imaging of the chest was performed using the standard protocol during bolus administration of intravenous contrast. Multiplanar CT image reconstructions and MIPs were obtained to evaluate the vascular  anatomy. CONTRAST:  100mL ISOVUE-370 IOPAMIDOL (ISOVUE-370) INJECTION 76% IV COMPARISON:  None FINDINGS: Cardiovascular: Aorta normal caliber without aneurysm or dissection. No atherosclerotic calcifications. Heart unremarkable. No pericardial effusion. Pulmonary arteries patent on non targeted exam without obvious filling defect. Mediastinum/Nodes: Base of cervical region normal appearance. Cough esophagus normal appearance. No thoracic adenopathy. Lungs/Pleura: Lungs clear. No infiltrate, pleural effusion, pneumothorax or mass. Upper Abdomen: Unremarkable Musculoskeletal: Normal appearance Review of the MIP images confirms the above findings. IMPRESSION: Normal CTA chest. Electronically Signed   By: Ulyses SouthwardMark  Boles M.D.   On: 01/30/2018 09:12     Medications:  Scheduled: . aspirin EC  81 mg Oral Daily  . atorvastatin  40 mg Oral q1800  . carvedilol  25 mg Oral BID WC  . enoxaparin (LOVENOX) injection  40 mg Subcutaneous Q24H   Continuous:  ZOX:[id number]:acetaminophen, hydrALAZINE, morphine injection, nitroGLYCERIN, ondansetron (ZOFRAN) IV, zolpidem  Assessment/Plan:    Chest pain Patient with significant EKG abnormalities.  He ruled out for acute coronary syndrome.  Echocardiogram was done showed apical hypertrophy, with normal systolic function of the left ventricle and grade 1 diastolic dysfunction.  Patient seen by cardiology.  Cardiac MRI done today which again shows apical hypertrophy without any high risk features.  Patient also experienced drop in blood pressure with dizziness.  Amlodipine was discontinued.  Further management per cardiology.  Hypokalemia Repleted  Hypertensive urgency Blood pressure was significantly elevated at the time of admission with blood pressure 208 systolic.  Patient was started on amlodipine and he was also started on carvedilol.  Blood pressure noted to be low this morning with some dizziness.  Amlodipine has been discontinued.  Patient does not take any  antihypertensives at home.  Monitor closely.  Left arm numbness No focal deficits.  Possibly related to his chest pain.  MRI cervical spine does not show any significant findings.  Monitor for now.  Dyslipidemia LDL 126. Started on statin.  DVT Prophylaxis: Lovenox    Code Status: Full code Family Communication: Discussed with the patient and his wife Disposition Plan: Management as outlined above.  Await further cardiology input.  Due to symptomatic hypotension would prefer to watch the patient overnight.    LOS: 0 days   Osvaldo ShipperGokul Khara Renaud  [facility] Pager [phone] 01/31/2018, 3:21 PM  If 7PM-7AM, please contact night-coverage at [email or web address], password [email or web address]

## 2018-01-31 NOTE — Plan of Care (Signed)
  Problem: Education: Goal: Knowledge of General Education information will improve Description Including pain rating scale, medication(s)/side effects and non-pharmacologic comfort measures Outcome: Progressing   Problem: Clinical Measurements: Goal: Diagnostic test results will improve Outcome: Progressing Goal: Cardiovascular complication will be avoided Outcome: Progressing   Problem: Activity: Goal: Risk for activity intolerance will decrease Outcome: Progressing   Problem: Elimination: Goal: Will not experience complications related to bowel motility Outcome: Progressing Goal: Will not experience complications related to urinary retention Outcome: Progressing   Problem: Safety: Goal: Ability to remain free from injury will improve Outcome: Progressing   Problem: Skin Integrity: Goal: Risk for impaired skin integrity will decrease Outcome: Progressing   Problem: Education: Goal: Individualized Educational Video(s) Outcome: Progressing   Problem: Cardiac: Goal: Ability to achieve and maintain adequate cardiovascular perfusion will improve Outcome: Progressing   Problem: Health Behavior/Discharge Planning: Goal: Ability to safely manage health-related needs after discharge will improve Outcome: Progressing

## 2018-02-01 DIAGNOSIS — I160 Hypertensive urgency: Secondary | ICD-10-CM | POA: Diagnosis not present

## 2018-02-01 DIAGNOSIS — R0789 Other chest pain: Secondary | ICD-10-CM | POA: Diagnosis not present

## 2018-02-01 DIAGNOSIS — I422 Other hypertrophic cardiomyopathy: Secondary | ICD-10-CM | POA: Diagnosis not present

## 2018-02-01 LAB — BASIC METABOLIC PANEL
Anion gap: 8 (ref 5–15)
BUN: 14 mg/dL (ref 6–20)
CO2: 28 mmol/L (ref 22–32)
Calcium: 9.1 mg/dL (ref 8.9–10.3)
Chloride: 104 mmol/L (ref 98–111)
Creatinine, Ser: 0.97 mg/dL (ref 0.61–1.24)
GFR calc Af Amer: >60 mL/min (ref >60)
Glucose, Bld: 102 mg/dL — ABNORMAL HIGH (ref 70–99)
Potassium: 3.9 mmol/L (ref 3.5–5.1)
SODIUM: 140 mmol/L (ref 135–145)

## 2018-02-01 LAB — CBC
HEMATOCRIT: 40.3 % (ref 39.0–52.0)
Hemoglobin: 13.6 g/dL (ref 13.0–17.0)
MCH: 33.5 pg (ref 26.0–34.0)
MCHC: 33.7 g/dL (ref 30.0–36.0)
MCV: 99.3 fL (ref 78.0–100.0)
Platelets: 223 10*3/uL (ref 150–400)
RBC: 4.06 MIL/uL — ABNORMAL LOW (ref 4.22–5.81)
RDW: 12.6 % (ref 11.5–15.5)
WBC: 7.4 10*3/uL (ref 4.0–10.5)

## 2018-02-01 LAB — HEPATIC FUNCTION PANEL
ALT: 56 U/L — ABNORMAL HIGH (ref 0–44)
AST: 60 U/L — ABNORMAL HIGH (ref 15–41)
Albumin: 3.6 g/dL (ref 3.5–5.0)
Alkaline Phosphatase: 67 U/L (ref 38–126)
Bilirubin, Direct: <0.1 mg/dL (ref 0.0–0.2)
TOTAL PROTEIN: 6.3 g/dL — AB (ref 6.5–8.1)
Total Bilirubin: 0.7 mg/dL (ref 0.3–1.2)

## 2018-02-01 MED ORDER — CARVEDILOL 25 MG PO TABS: 25.0000 mg | ORAL_TABLET | Freq: Two times a day (BID) | ORAL | 1 refills | Status: DC

## 2018-02-01 MED ORDER — ASPIRIN 81 MG PO TBEC: 81.0000 mg | DELAYED_RELEASE_TABLET | Freq: Every day | ORAL | 1 refills | Status: AC

## 2018-02-01 MED ORDER — ATORVASTATIN CALCIUM 40 MG PO TABS: 40.0000 mg | ORAL_TABLET | Freq: Every day | ORAL | 0 refills | Status: DC

## 2018-02-01 NOTE — Progress Notes (Addendum)
Progress Note  Patient Name: Patrick Santana Date of Encounter: 02/01/2018  Primary Cardiologist: New>>Dr Tavaris Eudy   Subjective   No chest pain, no SOB. No FH of SCD in siblings, cousins, parents  Inpatient Medications    Scheduled Meds: . aspirin EC  81 mg Oral Daily  . atorvastatin  40 mg Oral q1800  . carvedilol  25 mg Oral BID WC  . enoxaparin (LOVENOX) injection  40 mg Subcutaneous Q24H   Continuous Infusions:  PRN Meds: acetaminophen, hydrALAZINE, morphine injection, nitroGLYCERIN, ondansetron (ZOFRAN) IV, zolpidem   Vital Signs    Vitals:   01/31/18 1948 02/01/18 0107 02/01/18 0416 02/01/18 0755  BP: (!) 142/70 112/77 114/70 126/86  Pulse: 68 69 (!) 57 72  Resp: 16 16 16 18   Temp: 98.1 F (36.7 C) 97.6 F (36.4 C) 97.7 F (36.5 C) 97.6 F (36.4 C)  TempSrc: Oral Oral Oral Oral  SpO2: 100% 100% 100% 100%  Weight:   69.5 kg   Height:        Intake/Output Summary (Last 24 hours) at 02/01/2018 0815 Last data filed at 01/31/2018 1700 Gross per 24 hour  Intake 480 ml  Output -  Net 480 ml   Filed Weights   01/30/18 0005 01/31/18 0410 02/01/18 0416  Weight: 69.4 kg 69 kg 69.5 kg   Physical Exam   General:  Young male, NAD  Skin: Warm, dry, intact  Head: Normocephalic, atraumatic, clear, moist mucus membranes. Neck: Negative for carotid bruits. No JVD Lungs:clear ,  Cardiovascular: RR , no murmurs  Abdomen: Soft, non-tender, non-distended with normoactive bowel sounds. No obvious abdominal masses. MSK: Strength and tone appear normal for age. 5/5 in all extremities Extremities: No edema. No clubbing or cyanosis. DP/PT pulses 2+ bilaterally Neuro: Alert and oriented. No focal deficits. No facial asymmetry. MAE spontaneously. Psych: Responds to questions appropriately with normal affect.    Labs    Chemistry Recent Labs  Lab 01/29/18 2038 01/31/18 0352 02/01/18 0348  NA 137 138 140  K 3.1* 3.8 3.9  CL 100 100 104  CO2 26 28 28   GLUCOSE  119* 109* 102*  BUN 10 14 14   CREATININE 0.92 0.98 0.97  CALCIUM 9.3 9.0 9.1  GFRNONAA >60 >60 >60  GFRAA >60 >60 >60  ANIONGAP 11 10 8      Hematology Recent Labs  Lab 01/29/18 2038 02/01/18 0348  WBC 8.9 7.4  RBC 4.30 4.06*  HGB 14.6 13.6  HCT 41.0 40.3  MCV 95.3 99.3  MCH 34.0 33.5  MCHC 35.6 33.7  RDW 12.7 12.6  PLT 236 223    Cardiac Enzymes Recent Labs  Lab 01/29/18 2038 01/30/18 0335 01/30/18 0818 01/30/18 1404  TROPONINI 0.03* <0.03 <0.03 <0.03    Radiology    Mr Cervical Spine Wo Contrast  Result Date: 01/30/2018 CLINICAL DATA:  Radiculopathy. Pain in the left arm and left-sided chest pain. EXAM: MRI CERVICAL SPINE WITHOUT CONTRAST TECHNIQUE: Multiplanar, multisequence MR imaging of the cervical spine was performed. No intravenous contrast was administered. COMPARISON:  None. FINDINGS: Alignment: Slight straightening of the cervical lordosis. Vertebrae: No fracture, evidence of discitis, or bone lesion. Cord: Normal signal and morphology. Posterior Fossa, vertebral arteries, paraspinal tissues: Negative. Disc levels: C2-3 through C4-5: The discs are normal. No foraminal or spinal stenosis. C5-6: There is a small soft disc protrusion just to the right of midline extending into the right lateral recess and proximal right neural foramen. No spinal cord compression. No significant foraminal stenosis.  C6-7 and C7-T1: Normal. No appreciable facet arthritis in the cervical spine. Incidental note is made of a prominent epidural vein in the left lateral recesses extending from C1 through C5. This is felt to be an anatomic variant. IMPRESSION: 1. No evidence of left-sided neural impingement. 2. Small soft disc protrusion at C5-6 to the right which might affect the right C6 nerve. Electronically Signed   By: Francene Boyers M.D.   On: 01/30/2018 12:54   Mr Cardiac Morphology W Wo Contrast  Result Date: 01/31/2018 CLINICAL DATA:  42 year old male with suspicion for hypertrophic  cardiomyopathy. EXAM: CARDIAC MRI TECHNIQUE: The patient was scanned on a 1.5 Tesla GE magnet. A dedicated cardiac coil was used. Functional imaging was done using Fiesta sequences. 2,3, and 4 chamber views were done to assess for RWMA's. Modified Simpson's rule using a short axis stack was used to calculate an ejection fraction on a dedicated work [employer]. The patient received cc of Multihance. After 10 minutes inversion recovery sequences were used to assess for infiltration and scar tissue. CONTRAST:  cc  of Multihance FINDINGS: 1. Normal left ventricular size, with moderate hypertrophy of the basal and mid segments and severe hypertrophy of the apical segments with hyperdynamic systolic function (LVEF = 69%). There are no regional wall motion abnormalities. There is no late gadolinium enhancement in the left ventricular myocardium and no apical blood trapping or aneurysm. LVEDD: 43 mm LVESD: 24 mm LVEDV: 110 ml LVESV: 34 ml SV: 76 ml CO: 4.3 L/min Myocardial mass: 166 g 2. Normal right ventricular size, thickness and systolic function (LVEF = 64%). There are no regional wall motion abnormalities. 3.  Normal left and right atrial size. 4. Normal size of the aortic root, ascending aorta and pulmonary artery. 5.  No significant valvular abnormalities. 6.  Normal pericardium.  Mild circumferential pericardial effusion. IMPRESSION: 1. Normal left ventricular size, with moderate hypertrophy of the basal and mid segments and severe hypertrophy of the apical segments with hyperdynamic systolic function (LVEF = 69%). There are no regional wall motion abnormalities. There is no late gadolinium enhancement in the left ventricular myocardium and no apical blood trapping or aneurysm. 2. Normal right ventricular size, thickness and systolic function (LVEF = 64%). There are no regional wall motion abnormalities. 3.  Normal left and right atrial size. 4. Normal size of the aortic root, ascending aorta and  pulmonary artery. 5.  No significant valvular abnormalities. 6.  Normal pericardium.  Mild circumferential pericardial effusion. 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 Electronically Signed   By: Tobias Alexander   On: 01/31/2018 14:40   Ct Angio Chest Aorta W/cm &/or Wo/cm  Result Date: 01/30/2018 CLINICAL DATA:  Chest pain intermittently on LEFT rated at up to 5/10 in severity, LEFT arm numbness, history hypertension EXAM: CT ANGIOGRAPHY CHEST WITH CONTRAST TECHNIQUE: Multidetector CT imaging of the chest was performed using the standard protocol during bolus administration of intravenous contrast. Multiplanar CT image reconstructions and MIPs were obtained to evaluate the vascular anatomy. CONTRAST:  ISOVUE-370 IOPAMIDOL (ISOVUE-370) INJECTION 76% IV COMPARISON:  None FINDINGS: Cardiovascular: Aorta normal caliber without aneurysm or dissection. No atherosclerotic calcifications. Heart unremarkable. No pericardial effusion. Pulmonary arteries patent on non targeted exam without obvious filling defect. Mediastinum/Nodes: Base of cervical region normal appearance. Cough esophagus normal appearance. No thoracic adenopathy. Lungs/Pleura: Lungs clear. No infiltrate, pleural effusion, pneumothorax or mass. Upper Abdomen: Unremarkable Musculoskeletal: Normal appearance Review  of the MIP images confirms the above findings. IMPRESSION: Normal CTA chest. Electronically Signed   By: Ulyses Southward M.D.   On: 01/30/2018 09:12   Telemetry    01/31/18 NSR, sinus brady 50s - Personally Reviewed  ECG    No new tracing since 01/31/18- Personally Reviewed  Cardiac Studies   Echocardiogram 01/30/2018: Study Conclusions  - Left ventricle: The cavity size was normal. There was mild   concentric hypertrophy. Systolic function was vigorous. The   estimated ejection fraction was in the range of 65% to 70%. Wall   motion was normal;  there were no regional wall motion   abnormalities. Doppler parameters are consistent with abnormal   left ventricular relaxation (grade 1 diastolic dysfunction). - Aortic valve: There was trivial regurgitation.  Patient Profile     42 y.o. male with a hx of hypertension (not on medications) who is being seen today for the evaluation of chest pain at the request of Dr. Clyde Lundborg.  Assessment & Plan    1.  Chest pain with abnormal EKG: -Patient presented from [facility] with abnormal EKG and complaints of intermittent, exertional left arm tingling with mild anterior chest pain with SL NTG (2/10). He has no known known personal or family history of CAD. Initial SBP noted to be markedly elevated in the 200s,which has now improved on rx.  -Troponin,<0.03, <0.03, <0.03 -EKG markedly abnormal with diffuse T wave inversions and new ST elevation in inferior leads -CTA chest/aorta with no dissection  -Echocardiogram with normal LVEF 65-70% with no WMA and evidence of apical cardiomyopathy>>cardiac MRI w/ apical HCM but no high-risk features, report above - MD advise if genetic testing needed -Continue ASA, BB, and statin   2.  Hypertension: -Elevated, 142/70>>112/77>>114/70>>126/86 -Initial BP on arrival, 208/115 -Continue amlodipine 5 mg, carvedilol 25 mg - minor bradycardia on Coreg, no sx>> no dose change  3.  Hypokalemia: -K+, 3.9 today, stable -Monitor with BMET periodically  4. HLD: -Elevated LDL, 156 -Statin therapy with Lipitor 40mg  initially -Will need LFT's and lipids in 6 weeks  - ck LFTs now  SignedTheodore Demark, PA-C 02/01/2018 8:16 AM Beeper [phone]    For questions or updates, please contact   Please consult [email or web address] for contact info under Cardiology/STEMI.   Attending Note:   The patient was seen and examined.  Agree with assessment and plan as noted above.  Changes made to the above note as needed.  Patient seen and independently examined  with  Rhnoda Barrett, PA .   We discussed all aspects of the encounter. I agree with the assessment and plan as stated above.  1.   Apical hypertrophy:   BP is now normal On coreg 25 mg BID.   No evidence of obstruction on MRI   We'll follow-up with him in about 4 weeks. Good blood pressure Blood pressure recorded every day.    I have spent a total of 40 minutes with patient reviewing hospital  notes , telemetry, EKGs, labs and examining patient as well as establishing an assessment and plan that was discussed with the patient. > 50% of time was spent in direct patient care.    Vesta Mixer, Montez Hageman., MD, [facility] 02/01/2018, 9:44 AM [address]  [address] Office [phone] Pager [phone]

## 2018-02-01 NOTE — Discharge Summary (Signed)
[facility]  Physician Discharge Summary   Patient ID: Patrick Santana MRN: [id number] DOB/AGE: 07/21/1975 42 y.o.  Admit date: 01/29/2018 Discharge date: 02/01/2018  PCP: Patient, No Pcp Per  DISCHARGE DIAGNOSES:  Apical hypertrophy Essential hypertension Dyslipidemia  RECOMMENDATIONS FOR OUTPATIENT FOLLOW UP: 1. LFTs to be monitored as outpatient as patient has been started on statin 2. Cardiology to arrange outpatient follow-up  DISCHARGE CONDITION: fair  Diet recommendation: Heart healthy  Filed Weights   01/30/18 0005 01/31/18 0410 02/01/18 0416  Weight: 69.4 kg 69 kg 69.5 kg    INITIAL HISTORY: 42 y.o.malewith medical history significant ofhypertension not on medications,who presented with chest pain, left arm numbness.  Patient was noted to be mildly hypertensive.  He was hospitalized for further management.  Cardiology was consulted.  Consultants: Cardiology  Procedures:   Echocardiogram Study Conclusions  - Left ventricle: The cavity size was normal. There was moderate concentric hypertrophy of the apex. The appearance was consistent with apical variant hypertrophic cardiomyopathy. Systolic function was vigorous. The estimated ejection fraction was in the range of 65% to 70%. Wall motion was normal; there were no regional wall motion abnormalities. Doppler parameters are consistent with abnormal left ventricular relaxation (grade 1 diastolic dysfunction). - Aortic valve: There was trivial regurgitation.   HOSPITAL COURSE:   Left ventricular hypertrophy with severe hypertrophy noted in apex and moderate hypertrophy of basal and mid segments EKG showed significant abnormalities.  Patient was hospitalized.  He ruled out for acute coronary syndrome.  Echocardiogram was done showed apical hypertrophy, with normal systolic function of the left ventricle and grade 1 diastolic dysfunction.  Patient seen by cardiology.  Cardiac MRI  also showed apical hypertrophy without any high risk features.    Seen by cardiology again today.  No further work-up at this time.  Close outpatient follow-up with cardiology.  Hypokalemia Repleted  Essential hypertension, new diagnosis Blood pressure was significantly elevated at the time of admission with blood pressure 208 systolic.  Patient was started on amlodipine and he was also started on carvedilol.    Patient did experience a drop in blood pressure yesterday.  Amlodipine was discontinued.  Patient blood pressure has stabilized.  He will be discharged on carvedilol.    Left arm numbness No focal deficits.  Possibly related to his chest pain.  MRI cervical spine does not show any significant findings.   Dyslipidemia LDL 126. Started on statin.  LFTs noted to be mildly abnormal.  Will need outpatient monitoring.  Overall stable.  Cleared by cardiology. Ok for discharge.   PERTINENT LABS:  The results of significant diagnostics from this hospitalization (including imaging, microbiology, ancillary and laboratory) are listed below for reference.     Labs: Basic Metabolic Panel: Recent Labs  Lab 01/29/18 2038 01/30/18 0818 01/31/18 0352 02/01/18 0348  NA 137  --  138 140  K 3.1*  --  3.8 3.9  CL 100  --  100 104  CO2 26  --  28 28  GLUCOSE 119*  --  109* 102*  BUN 10  --  14 14  CREATININE 0.92  --  0.98 0.97  CALCIUM 9.3  --  9.0 9.1  MG  --  2.1  --   --    Liver Function Tests: Recent Labs  Lab 02/01/18 0348  AST 60*  ALT 56*  ALKPHOS 67  BILITOT 0.7  PROT 6.3*  ALBUMIN 3.6   CBC: Recent Labs  Lab 01/29/18 2038 02/01/18 0348  WBC 8.9 7.4  NEUTROABS 6.4  --   HGB 14.6 13.6  HCT 41.0 40.3  MCV 95.3 99.3  PLT 236 223   Cardiac Enzymes: Recent Labs  Lab 01/29/18 2038 01/30/18 0335 01/30/18 0818 01/30/18 1404  TROPONINI 0.03* <0.03 <0.03 <0.03    IMAGING STUDIES Dg Chest 2 View  Result Date: 01/29/2018 CLINICAL DATA:  Left arm numbness  with left-sided chest pain for the past 2 days. EXAM: CHEST - 2 VIEW COMPARISON:  None. FINDINGS: The heart size and mediastinal contours are within normal limits. Both lungs are clear. The visualized skeletal structures are unremarkable. IMPRESSION: No active cardiopulmonary disease. Electronically Signed   By: Tollie Eth M.D.   On: 01/29/2018 21:08   Mr Cervical Spine Wo Contrast  Result Date: 01/30/2018 CLINICAL DATA:  Radiculopathy. Pain in the left arm and left-sided chest pain. EXAM: MRI CERVICAL SPINE WITHOUT CONTRAST TECHNIQUE: Multiplanar, multisequence MR imaging of the cervical spine was performed. No intravenous contrast was administered. COMPARISON:  None. FINDINGS: Alignment: Slight straightening of the cervical lordosis. Vertebrae: No fracture, evidence of discitis, or bone lesion. Cord: Normal signal and morphology. Posterior Fossa, vertebral arteries, paraspinal tissues: Negative. Disc levels: C2-3 through C4-5: The discs are normal. No foraminal or spinal stenosis. C5-6: There is a small soft disc protrusion just to the right of midline extending into the right lateral recess and proximal right neural foramen. No spinal cord compression. No significant foraminal stenosis. C6-7 and C7-T1: Normal. No appreciable facet arthritis in the cervical spine. Incidental note is made of a prominent epidural vein in the left lateral recesses extending from C1 through C5. This is felt to be an anatomic variant. IMPRESSION: 1. No evidence of left-sided neural impingement. 2. Small soft disc protrusion at C5-6 to the right which might affect the right C6 nerve. Electronically Signed   By: Francene Boyers M.D.   On: 01/30/2018 12:54   Mr Cardiac Morphology W Wo Contrast  Result Date: 01/31/2018 CLINICAL DATA:  42 year old male with suspicion for hypertrophic cardiomyopathy. EXAM: CARDIAC MRI TECHNIQUE: The patient was scanned on a 1.5 Tesla GE magnet. A dedicated cardiac coil was used. Functional imaging was  done using Fiesta sequences. 2,3, and 4 chamber views were done to assess for RWMA's. Modified Simpson's rule using a short axis stack was used to calculate an ejection fraction on a dedicated work [employer]. The patient received cc of Multihance. After 10 minutes inversion recovery sequences were used to assess for infiltration and scar tissue. CONTRAST:  cc  of Multihance FINDINGS: 1. Normal left ventricular size, with moderate hypertrophy of the basal and mid segments and severe hypertrophy of the apical segments with hyperdynamic systolic function (LVEF = 69%). There are no regional wall motion abnormalities. There is no late gadolinium enhancement in the left ventricular myocardium and no apical blood trapping or aneurysm. LVEDD: 43 mm LVESD: 24 mm LVEDV: 110 ml LVESV: 34 ml SV: 76 ml CO: 4.3 L/min Myocardial mass: 166 g 2. Normal right ventricular size, thickness and systolic function (LVEF = 64%). There are no regional wall motion abnormalities. 3.  Normal left and right atrial size. 4. Normal size of the aortic root, ascending aorta and pulmonary artery. 5.  No significant valvular abnormalities. 6.  Normal pericardium.  Mild circumferential pericardial effusion. IMPRESSION: 1. Normal left ventricular size, with moderate hypertrophy of the basal and mid segments and severe hypertrophy of the apical segments with hyperdynamic systolic function (LVEF = 69%). There are no regional wall motion abnormalities.  There is no late gadolinium enhancement in the left ventricular myocardium and no apical blood trapping or aneurysm. 2. Normal right ventricular size, thickness and systolic function (LVEF = 64%). There are no regional wall motion abnormalities. 3.  Normal left and right atrial size. 4. Normal size of the aortic root, ascending aorta and pulmonary artery. 5.  No significant valvular abnormalities. 6.  Normal pericardium.  Mild circumferential pericardial effusion. 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 Electronically Signed   By: Tobias AlexanderKatarina  Nelson   On: 01/31/2018 14:40   Ct Angio Chest Aorta W/cm &/or Wo/cm  Result Date: 01/30/2018 CLINICAL DATA:  Chest pain intermittently on LEFT rated at up to 5/10 in severity, LEFT arm numbness, history hypertension EXAM: CT ANGIOGRAPHY CHEST WITH CONTRAST TECHNIQUE: Multidetector CT imaging of the chest was performed using the standard protocol during bolus administration of intravenous contrast. Multiplanar CT image reconstructions and MIPs were obtained to evaluate the vascular anatomy. CONTRAST:  100mL ISOVUE-370 IOPAMIDOL (ISOVUE-370) INJECTION 76% IV COMPARISON:  None FINDINGS: Cardiovascular: Aorta normal caliber without aneurysm or dissection. No atherosclerotic calcifications. Heart unremarkable. No pericardial effusion. Pulmonary arteries patent on non targeted exam without obvious filling defect. Mediastinum/Nodes: Base of cervical region normal appearance. Cough esophagus normal appearance. No thoracic adenopathy. Lungs/Pleura: Lungs clear. No infiltrate, pleural effusion, pneumothorax or mass. Upper Abdomen: Unremarkable Musculoskeletal: Normal appearance Review of the MIP images confirms the above findings. IMPRESSION: Normal CTA chest. Electronically Signed   By: Ulyses SouthwardMark  Boles M.D.   On: 01/30/2018 09:12    DISCHARGE EXAMINATION: Vitals:   01/31/18 1948 02/01/18 0107 02/01/18 0416 02/01/18 0755  BP: (!) 142/70 112/77 114/70 126/86  Pulse: 68 69 (!) 57 72  Resp: 16 16 16 18   Temp: 98.1 F (36.7 C) 97.6 F (36.4 C) 97.7 F (36.5 C) 97.6 F (36.4 C)  TempSrc: Oral Oral Oral Oral  SpO2: 100% 100% 100% 100%  Weight:   69.5 kg   Height:       General appearance: alert, cooperative, appears stated age and no distress Resp: clear to auscultation bilaterally Cardio: regular rate and rhythm, S1, S2 normal, no murmur, click, rub or gallop GI: soft,  non-tender; bowel sounds normal; no masses,  no organomegaly  DISPOSITION: Home  Discharge Instructions    Call MD for:  difficulty breathing, headache or visual disturbances   Complete by:  As directed    Call MD for:  extreme fatigue   Complete by:  As directed    Call MD for:  persistant dizziness or light-headedness   Complete by:  As directed    Call MD for:  persistant nausea and vomiting   Complete by:  As directed    Call MD for:  severe uncontrolled pain   Complete by:  As directed    Call MD for:  temperature >100.4   Complete by:  As directed    Diet - low sodium heart healthy   Complete by:  As directed    Discharge instructions   Complete by:  As directed    Cardiologist will schedule follow-up appointment in 4 weeks.  Seek attention if you become dizzy or lightheaded again.  Avoid exertional activities and strenuous exercise for now.  You were cared for by a hospitalist during your hospital stay. If you have any questions about your discharge medications or the care you received while you were in the hospital after you are discharged, you can  call the unit and asked to speak with the hospitalist on call if the hospitalist that took care of you is not available. Once you are discharged, your primary care physician will handle any further medical issues. Please note that NO REFILLS for any discharge medications will be authorized once you are discharged, as it is imperative that you return to your primary care physician (or establish a relationship with a primary care physician if you do not have one) for your aftercare needs so that they can reassess your need for medications and monitor your lab values. If you do not have a primary care physician, you can call [phone] for a physician referral.   Increase activity slowly   Complete by:  As directed         Allergies as of 02/01/2018   No Known Allergies     Medication List    TAKE these medications   aspirin 81 MG EC  tablet Take 1 tablet (81 mg total) by mouth daily. Start taking on:  02/02/2018   atorvastatin 40 MG tablet Commonly known as:  LIPITOR Take 1 tablet (40 mg total) by mouth daily at 6 PM.   carvedilol 25 MG tablet Commonly known as:  COREG Take 1 tablet (25 mg total) by mouth 2 (two) times daily with a meal.   folic acid 1 MG tablet Commonly known as:  FOLVITE Take 1 mg by mouth daily.   multivitamin tablet Take 1 tablet by mouth daily.          TOTAL DISCHARGE TIME: 35 mins  Osvaldo Shipper  [facility] Pager [phone]  02/01/2018, 2:11 PM

## 2018-02-01 NOTE — Discharge Instructions (Signed)

## 2018-03-06 ENCOUNTER — Other Ambulatory Visit: Payer: Self-pay | Admitting: *Deleted

## 2018-03-06 MED ORDER — ATORVASTATIN CALCIUM 40 MG PO TABS: 40.0000 mg | ORAL_TABLET | Freq: Every day | ORAL | 11 refills | Status: DC

## 2018-03-06 NOTE — Telephone Encounter (Signed)
Please refill medications for patient. Ok to give 6 refills. Tereso Newcomer, PA-C    03/06/2018 5:05 PM

## 2018-03-07 ENCOUNTER — Ambulatory Visit: Payer: 59 | Admitting: Physician Assistant

## 2018-03-23 ENCOUNTER — Ambulatory Visit: Payer: 59 | Admitting: Physician Assistant

## 2018-03-23 ENCOUNTER — Encounter: Payer: Self-pay | Admitting: Physician Assistant

## 2018-03-23 ENCOUNTER — Telehealth: Payer: Self-pay | Admitting: Physician Assistant

## 2018-03-23 VITALS — BP 146/82 | HR 67 | Ht 70.0 in | Wt 156.0 lb

## 2018-03-23 DIAGNOSIS — I422 Other hypertrophic cardiomyopathy: Secondary | ICD-10-CM

## 2018-03-23 DIAGNOSIS — I10 Essential (primary) hypertension: Secondary | ICD-10-CM

## 2018-03-23 DIAGNOSIS — E785 Hyperlipidemia, unspecified: Secondary | ICD-10-CM | POA: Diagnosis not present

## 2018-03-23 HISTORY — DX: Other hypertrophic cardiomyopathy: I42.2

## 2018-03-23 NOTE — Telephone Encounter (Signed)
Please tell patient I reviewed his case with some of our cardiologists.  We think it would be good for him to have a stress test to make sure his blood pressure increases when he exercises.  We also want to make sure he does not have abnormal heart rhythms when he exercises.  PLAN:  1. Please arrange an exercise tolerance test at some point in the next several weeks. 2. He will need to take his Coreg as usual on the day he has his stress test (do NOT hold the beta-blocker). Tereso Newcomer, PA-C    03/23/2018 1:26 PM

## 2018-03-23 NOTE — Telephone Encounter (Signed)
Pt has been advised of recommendations and is agreeable to plan of care. Pt will be scheduled for GXT and referral has been placed to see Dr. Sidney Ace for genetics testing. Pt aware Dr. Jomarie Longs is in our office. Pt aware schedulers will call him to schedule both GXT and Dr. Jomarie Longs. Pt thanked me for the call.

## 2018-03-23 NOTE — Telephone Encounter (Signed)
I have discussed with Dr. Elease Hashimoto.  He also needs a referral for genetic testing.  PLAN:  Please refer to genetic counseling (Dr. Sidney Ace) at our office.  Tereso Newcomer, PA-C    03/23/2018 1:55 PM

## 2018-03-23 NOTE — Progress Notes (Signed)
Cardiology Office Note:    Date:  03/23/2018   ID:  Patrick Santana, DOB Aug 19, 1975, MRN [id number]  PCP:  Patient, No Pcp Per  Cardiologist:  Kristeen Miss, MD  Electrophysiologist:  None   Referring MD: No ref. provider found   Chief Complaint  Patient presents with  . Hospitalization Follow-up    CP, HTN    History of Present Illness:    Patrick Santana is a 42 y.o. male with hypertension.  He was admitted in August 2019 with chest pain and an abnormal EKG.  Troponin levels remained negative.  Echocardiogram was suggestive of apical variant hypertrophic cardiomyopathy.  Ejection fraction was normal.  Chest CTA was negative for dissection.  There was no coronary calcification noted.  Cardiac MRI did confirm apical form of hypertrophic cardiomyopathy without high risk features.     Patrick Santana returns for follow-up.  He is here alone.  He brings in a list of his blood pressures.  Most of his readings have been optimal.  Blood pressure by me today is 128/88.  He did have influenza while traveling to [place] recently.  He did take some medications with decongestants which causes blood pressure to increase.  He has not had chest pain, shortness of breath, syncope, lower extremity swelling or paroxysmal nocturnal dyspnea.  Prior CV studies:   The following studies were reviewed today:  Cardiac MRI 01/31/18 IMPRESSION: 1. Normal left ventricular size, with moderate hypertrophy of the basal and mid segments and severe hypertrophy of the apical segments with hyperdynamic systolic function (LVEF = 69%). There are no regional wall motion abnormalities. There is no late gadolinium enhancement in the left ventricular myocardium and no apical blood trapping or aneurysm. 2. Normal right ventricular size, thickness and systolic function (LVEF = 64%). There are no regional wall motion abnormalities. 3.  Normal left and right atrial size. 4. Normal size of the aortic root,  ascending aorta and pulmonary artery. 5.  No significant valvular abnormalities. 6.  Normal pericardium.  Mild circumferential pericardial effusion. 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  Echo 01/30/2018 Moderate concentric LVH of the apex-consistent with apical variant hypertrophic cardiomyopathy, EF 65-70, normal wall motion, grade 1 diastolic dysfunction, trivial AI  Chest CTA 01/30/18 FINDINGS: Cardiovascular: Aorta normal caliber without aneurysm or dissection. No atherosclerotic calcifications. Heart unremarkable. No pericardial effusion. Pulmonary arteries patent on non targeted exam without obvious filling defect. Mediastinum/Nodes: Base of cervical region normal appearance. Cough esophagus normal appearance. No thoracic adenopathy. Lungs/Pleura: Lungs clear. No infiltrate, pleural effusion, pneumothorax or mass. Upper Abdomen: Unremarkable Musculoskeletal: Normal appearance IMPRESSION: Normal CTA chest.   Past Medical History:  Diagnosis Date  . 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 Atypical chest pain   . Elevated troponin level   . Essential hypertension    Surgical Hx: The patient  has a past surgical history that includes left ear drum.   Current Medications: Current Meds  Medication Sig  . aspirin EC 81 MG EC tablet Take 1 tablet (81 mg total) by mouth daily.  Marland Kitchen atorvastatin (LIPITOR) 40 MG tablet Take 1 tablet (40 mg total) by  mouth daily at 6 PM.  . carvedilol (COREG) 25 MG tablet Take 1 tablet (25 mg total) by mouth 2 (two) times daily with  a meal.  . folic acid (FOLVITE) 1 MG tablet Take 1 mg by mouth daily.  . Multiple Vitamin (MULTIVITAMIN) tablet Take 1 tablet by mouth daily.     Allergies:   Patient has no known allergies.   Social History   Tobacco Use  . Smoking status: Never Smoker  . Smokeless tobacco: Never Used  Substance Use Topics  . Alcohol use: Yes  . Drug use: No     Family Hx: The patient's family history includes Diabetes Mellitus II in his father and mother.  ROS:   Please see the history of present illness.    ROS All other systems reviewed and are negative.   EKGs/Labs/Other Test Reviewed:    EKG:  EKG is   ordered today.  The ekg ordered today demonstrates normal sinus rhythm, heart rate 67, LVH with repolarization abnormality, QTC 420, similar to old EKGs  Recent Labs: 01/30/2018: Magnesium 2.1 02/01/2018: ALT 56; BUN 14; Creatinine, Ser 0.97; Hemoglobin 13.6; Platelets 223; Potassium 3.9; Sodium 140   Recent Lipid Panel Lab Results  Component Value Date/Time   CHOL 265 (H) 01/30/2018 03:35 AM   TRIG 169 (H) 01/30/2018 03:35 AM   HDL 75 01/30/2018 03:35 AM   CHOLHDL 3.5 01/30/2018 03:35 AM   LDLCALC 156 (H) 01/30/2018 03:35 AM    Physical Exam:    VS:  BP (!) 146/82   Pulse 67   Ht 5\' 10"  (1.778 m)   Wt 156 lb (70.8 kg)   BMI 22.38 kg/m     Wt Readings from Last 3 Encounters:  03/23/18 156 lb (70.8 kg)  02/01/18 153 lb 3.2 oz (69.5 kg)  02/04/17 160 lb 15 oz (73 kg)     Physical Exam  Constitutional: He is oriented to person, place, and time. He appears well-developed and well-nourished. No distress.  HENT:  Head: Normocephalic and atraumatic.  Eyes: No scleral icterus.  Neck: No JVD present. No thyromegaly present.  Cardiovascular: Normal rate and regular rhythm.  No murmur heard. Pulmonary/Chest: Effort normal. He has no rales.  Abdominal: Soft.  Musculoskeletal: He exhibits no edema.  Lymphadenopathy:    He has no cervical adenopathy.  Neurological: He is alert  and oriented to person, place, and time.  Skin: Skin is warm and dry.  Psychiatric: He has a normal mood and affect.    ASSESSMENT & PLAN:    Apical variant hypertrophic cardiomyopathy (HCC) As noted, cardiac MRI without high risk features.  Continue beta-blocker therapy.  I will arrange an exercise tolerance test for risk stratification.  I will review further with Dr. Elease Hashimoto whether or not to refer to genetic counseling.  Essential hypertension The patient's blood pressure is controlled on his current regimen.  Continue current therapy.   Hyperlipidemia, unspecified hyperlipidemia type Continue statin.  Arrange follow-up lipids and LFTs.  Dispo:  Return in about 3 months (around 06/22/2018) for Routine Follow Up, w/ Dr. Elease Hashimoto.   Medication Adjustments/Labs and Tests Ordered: Current medicines are reviewed at length with the patient today.  Concerns regarding medicines are outlined above.  Tests Ordered: Orders Placed This Encounter  Procedures  . Hepatic function panel  . Lipid Profile  . Exercise Tolerance Test  . EKG 12-Lead   Medication Changes: No orders of the defined types were placed in this encounter.   Signed, Tereso Newcomer, PA-C  03/23/2018 1:31 PM    [facility] [address] [place], [place], [place]  [address] Phone: [phone]; Fax: [phone]

## 2018-03-23 NOTE — Patient Instructions (Addendum)
Medication Instructions:  1. Your physician recommends that you continue on your current medications as directed. Please refer to the Current Medication list given to you today.   Labwork: 4 WEEKS YOU WILL NEED FASTING LIPID AND LIVER PANEL TO BE DONE  Testing/Procedures: Your physician has requested that you have an exercise tolerance test. For further information please visit [email or web address] Please also follow instruction sheet, as given.   Follow-Up: DR. Elease Hashimoto IN 3 MONTHS   Any Other Special Instructions Will Be Listed Below (If Applicable).     If you need a refill on your cardiac medications before your next appointment, please call your pharmacy.

## 2018-04-06 ENCOUNTER — Telehealth: Payer: Self-pay | Admitting: *Deleted

## 2018-04-06 ENCOUNTER — Ambulatory Visit (INDEPENDENT_AMBULATORY_CARE_PROVIDER_SITE_OTHER): Payer: 59

## 2018-04-06 DIAGNOSIS — I422 Other hypertrophic cardiomyopathy: Secondary | ICD-10-CM

## 2018-04-06 LAB — EXERCISE TOLERANCE TEST
CHL CUP MPHR: 180 {beats}/min
CHL CUP RESTING HR STRESS: 69 {beats}/min
CHL RATE OF PERCEIVED EXERTION: 16
CSEPEDS: 0 s
CSEPEW: 13.4 METS
CSEPPHR: 155 {beats}/min
Exercise duration (min): 12 min
Percent HR: 86 %

## 2018-04-06 MED ORDER — ATORVASTATIN CALCIUM 40 MG PO TABS: 40.0000 mg | ORAL_TABLET | Freq: Every day | ORAL | 3 refills | Status: DC

## 2018-04-06 MED ORDER — CARVEDILOL 25 MG PO TABS: 25.0000 mg | ORAL_TABLET | Freq: Two times a day (BID) | ORAL | 3 refills | Status: DC

## 2018-04-06 NOTE — Telephone Encounter (Signed)
Pt has been notified of GXT results. Pt aware I will call back if there are any changes after Tereso Newcomer, PA has reviewed . Pt thanked me for the call.

## 2018-04-06 NOTE — Telephone Encounter (Signed)
-----   Message from Rosalio Macadamia, NP sent at 04/06/2018  4:20 PM EDT ----- Ok to report. Reviewed for Patrick Newcomer, PA The GXT showed good exercise tolerance, no chest pain and normal BP response.  The EKG is not interpretable due to resting changes. No arrhythmias noted.  Would keep follow up as planned with Dr. Elease Hashimoto in December.  No change in current regimen.   Will also have Scott review when he comes back for any other recommendations.

## 2018-04-20 ENCOUNTER — Other Ambulatory Visit: Payer: 59

## 2018-04-25 ENCOUNTER — Other Ambulatory Visit: Payer: 59

## 2018-05-02 ENCOUNTER — Other Ambulatory Visit: Payer: 59

## 2018-05-09 ENCOUNTER — Other Ambulatory Visit: Payer: Self-pay | Admitting: Nurse Practitioner

## 2018-05-09 ENCOUNTER — Other Ambulatory Visit: Payer: 59

## 2018-05-09 DIAGNOSIS — I422 Other hypertrophic cardiomyopathy: Secondary | ICD-10-CM

## 2018-05-10 ENCOUNTER — Ambulatory Visit: Payer: 59 | Admitting: Genetic Counselor

## 2018-05-10 DIAGNOSIS — I422 Other hypertrophic cardiomyopathy: Secondary | ICD-10-CM

## 2018-05-11 ENCOUNTER — Other Ambulatory Visit: Payer: 59 | Admitting: *Deleted

## 2018-05-11 DIAGNOSIS — E785 Hyperlipidemia, unspecified: Secondary | ICD-10-CM | POA: Diagnosis not present

## 2018-05-11 LAB — LIPID PANEL
CHOL/HDL RATIO: 2.3 ratio (ref 0.0–5.0)
CHOLESTEROL TOTAL: 131 mg/dL (ref 100–199)
HDL: 57 mg/dL (ref >39)
LDL CALC: 49 mg/dL (ref 0–99)
TRIGLYCERIDES: 125 mg/dL (ref 0–149)
VLDL CHOLESTEROL CAL: 25 mg/dL (ref 5–40)

## 2018-05-11 LAB — HEPATIC FUNCTION PANEL
ALBUMIN: 4.5 g/dL (ref 3.5–5.5)
ALT: 38 IU/L (ref 0–44)
AST: 49 IU/L — ABNORMAL HIGH (ref 0–40)
Alkaline Phosphatase: 84 IU/L (ref 39–117)
Bilirubin Total: 0.9 mg/dL (ref 0.0–1.2)
Bilirubin, Direct: 0.24 mg/dL (ref 0.00–0.40)
TOTAL PROTEIN: 6.8 g/dL (ref 6.0–8.5)

## 2018-05-17 NOTE — Progress Notes (Signed)
Referral Reason  Patrick Santana was referred for genetic testing by Dr. Elease HashimotoNahser subsequent to an MRI that was suggestive of an apical variant of hypertrophic cardiomyopathy.   Genetic Consultation Notes  Patrick Santana was counseled on the genetics of hypertrophic cardiomyopathy (HCM), namely its autosomal dominant inheritance. We also talked about incomplete penetrance, variable expression and digenic/compound mutations that can be seen in some patients with HCM. We briefly discussed the inheritance pattern and treatment plans for the infiltrative cardiomyopathies that present as HCM phenocopies.   We walked through the process of genetic testing. I explained to him that there are three possible outcomes of genetic testing; namely positive, negative and variant of unknown significance. A positive outcome can be expected in about 70%-75% of HCM cases that do not have risk factors for HCM, present early in life with increased severity and have a family history of sudden cardiac death and/or a relative that has been diagnosed with HCM. A negative test does not exclude a genetic basis for HCM. Limitations in current genetic testing methodology can produce a negative result. Variants of unknown significance (VUS) are typically observed in about 10%-15% of cases. However, this number can increase if genetic testing is performed by a laboratory that employs extremely large gene panels of 9 or more sarcomeric genes. I explained to him that typically a VUS is so classified if the variant is not well understood as very few individuals have been reported to harbor this variant or its role in gene function has not been elucidated. The potential outcomes of genetic testing and subsequent management of at-risk family members were discussed so as to manage expectations. His medical and 4-generation family history was obtained. See details below-  Personal Medical Information Patrick Santana (III.2 on pedigree) is a very pleasant  42 year-old [place] gentleman who works as a Sport and exercise psychologistsoftware engineer. He informs me that in the summer of 2019 he felt a very mild numbness in his left arm while he was hiking. Two days later, he began experiencing a light tingling in his left arm. He reports going to the ER to have this assessed and was told that his blood pressure was elevated. He had an abnormal EKG  and after his echocardiogram was performed, he was asked to stay overnight at the hospital for observation. He was seen later by Tereso NewcomerScott Weaver and was told that he had wall thickness suggestive of an apical variant of hypertrophic cardiomyopathy.  Patrick Santana has been asymptomatic and denies dyspnea, chest pains, or syncope and currently feels good.  Traditional Risk Factors Patrick Santana states that he was first informed of having elevated blood pressure when he was at the ER in August of this year. He states that his hypertension is well controlled by medication. He does not have the other typical risk factors of HCM, namely aortic valve stenosis and states or being a involved in competitive sporting activities.  Family history Margarita (III.2) is the younger of two siblings. He does not have children but states that he is concerned about their risk of having his condition. He has an elder sister (III.1) that recently began complaining of  shortness of breath while skiing. He tells me that she sought medical care for this and had a normal stress test. She has two healthy children, ages 4015 and 699 (IV.1-IV.2).  Osinachi's parents (II.7, II.8) live in [place] and do not have heart disease. Both have diabetes. There is no history of overt heart disease or sudden death amongst his paternal and maternal relatives (  II.1-II.15). His paternal grandparents (I.1, I.2) died of natural causes and his maternal grandparents (I.3, I.4) died from diabetes complications.  Impression  Heston is asymptomatic and was recently found to have wall thickness indicative of apical  variant of hypertrophic cardiomyopathy. There is no significant family history of HCM or sudden death in his family. In light of his early age of presentation in the absence of risk factors, it is likely that he may have a de novo pathogenic variant for HCM. Genetic testing is highly recommended to confirm his diagnosis and rule out the possibility of HCM phenocopy. The genetic test should include the major sarcomeric genes implicated in HCM as well as the HCM phenocopy genes.  Please note that the patient has not undergone genetic counseling to cope emotionally with having a likely genetic heart condition. Patient states that he is able to cope emotionally with his heart condition.   Plan Kyian is interested in genetic testing for HCM. Blood was drawn today and sent out for testing.    Sidney Ace, Ph.D, [facility] Clinical Molecular Geneticist

## 2018-05-18 ENCOUNTER — Telehealth: Payer: Self-pay | Admitting: Nurse Practitioner

## 2018-05-18 DIAGNOSIS — I422 Other hypertrophic cardiomyopathy: Secondary | ICD-10-CM

## 2018-05-18 NOTE — Telephone Encounter (Signed)
-----   Message from Vesta MixerPhilip J Nahser, MD sent at 05/17/2018  2:35 PM EST ----- I discussed the case with Dr. Filomena JunglingKlein Lets get a 48 hour holter monitor This will help risk stratify this patient He he is found to have episodes of fast  nonsustain VT, he will need to be considered for ICD   Thanks   ----- Message ----- From: Duke SalviaKlein, Steven C, MD Sent: 05/10/2018   5:50 PM EST To: Vesta MixerPhilip J Nahser, MD  Phil looking at patient with SumyJoseph today.  In terms of risk stratification, probably worth considering a Holter to exclude ventricular tachycardia.  Thanks

## 2018-05-18 NOTE — Telephone Encounter (Signed)
Called patient and reviewed advice regarding having the cardiac monitor placed. Dr. Elease HashimotoNahser advised patient may have the 3 day Zio patch monitor rather than the 48 hour holter. Patient is scheduled for 12/5 for monitor placement. He verbalized understanding and agreement with plan and thanked me for the call.

## 2018-05-31 ENCOUNTER — Other Ambulatory Visit: Payer: Self-pay | Admitting: Cardiovascular Disease

## 2018-05-31 ENCOUNTER — Ambulatory Visit (INDEPENDENT_AMBULATORY_CARE_PROVIDER_SITE_OTHER): Payer: 59

## 2018-05-31 DIAGNOSIS — I422 Other hypertrophic cardiomyopathy: Secondary | ICD-10-CM | POA: Diagnosis not present

## 2018-05-31 DIAGNOSIS — I493 Ventricular premature depolarization: Secondary | ICD-10-CM | POA: Diagnosis not present

## 2018-06-08 DIAGNOSIS — I422 Other hypertrophic cardiomyopathy: Secondary | ICD-10-CM | POA: Diagnosis not present

## 2018-06-08 DIAGNOSIS — I493 Ventricular premature depolarization: Secondary | ICD-10-CM | POA: Diagnosis not present

## 2018-06-13 ENCOUNTER — Ambulatory Visit: Payer: 59 | Admitting: Cardiovascular Disease

## 2018-06-27 ENCOUNTER — Emergency Department (HOSPITAL_BASED_OUTPATIENT_CLINIC_OR_DEPARTMENT_OTHER)
Admission: EM | Admit: 2018-06-27 | Discharge: 2018-06-28 | Disposition: A | Discharge status: Home / Self Care | Payer: 59 | Attending: Emergency Medicine | Admitting: Emergency Medicine

## 2018-06-27 ENCOUNTER — Other Ambulatory Visit: Payer: Self-pay

## 2018-06-27 ENCOUNTER — Encounter (HOSPITAL_BASED_OUTPATIENT_CLINIC_OR_DEPARTMENT_OTHER): Payer: Self-pay | Admitting: Emergency Medicine

## 2018-06-27 ENCOUNTER — Emergency Department (HOSPITAL_BASED_OUTPATIENT_CLINIC_OR_DEPARTMENT_OTHER): Payer: 59

## 2018-06-27 DIAGNOSIS — Z7982 Long term (current) use of aspirin: Secondary | ICD-10-CM | POA: Diagnosis not present

## 2018-06-27 DIAGNOSIS — I10 Essential (primary) hypertension: Secondary | ICD-10-CM | POA: Diagnosis not present

## 2018-06-27 DIAGNOSIS — Z79899 Other long term (current) drug therapy: Secondary | ICD-10-CM | POA: Diagnosis not present

## 2018-06-27 DIAGNOSIS — R0789 Other chest pain: Secondary | ICD-10-CM | POA: Diagnosis not present

## 2018-06-27 DIAGNOSIS — R202 Paresthesia of skin: Secondary | ICD-10-CM

## 2018-06-27 DIAGNOSIS — R079 Chest pain, unspecified: Secondary | ICD-10-CM | POA: Diagnosis not present

## 2018-06-27 LAB — BASIC METABOLIC PANEL
Anion gap: 8 (ref 5–15)
BUN: 14 mg/dL (ref 6–20)
CHLORIDE: 103 mmol/L (ref 98–111)
CO2: 24 mmol/L (ref 22–32)
Calcium: 9.1 mg/dL (ref 8.9–10.3)
Creatinine, Ser: 0.93 mg/dL (ref 0.61–1.24)
GFR calc Af Amer: >60 mL/min (ref >60)
GFR calc non Af Amer: >60 mL/min (ref >60)
Glucose, Bld: 114 mg/dL — ABNORMAL HIGH (ref 70–99)
Potassium: 3.8 mmol/L (ref 3.5–5.1)
SODIUM: 135 mmol/L (ref 135–145)

## 2018-06-27 LAB — CBC
HEMATOCRIT: 41.9 % (ref 39.0–52.0)
Hemoglobin: 14 g/dL (ref 13.0–17.0)
MCH: 32 pg (ref 26.0–34.0)
MCHC: 33.4 g/dL (ref 30.0–36.0)
MCV: 95.7 fL (ref 80.0–100.0)
Platelets: 261 10*3/uL (ref 150–400)
RBC: 4.38 MIL/uL (ref 4.22–5.81)
RDW: 13.1 % (ref 11.5–15.5)
WBC: 8 10*3/uL (ref 4.0–10.5)
nRBC: 0 % (ref 0.0–0.2)

## 2018-06-27 NOTE — ED Provider Notes (Addendum)
[facility] Provider Note: Patrick Santana Issam Carlyon, MD, FACEP  CSN: [id number] MRN: [id number] ARRIVAL: 06/27/18 at 2322 ROOM: MH03/[facility]03   CHIEF COMPLAINT  Chest Pain   HISTORY OF PRESENT ILLNESS  06/27/18 11:48 PM Patrick Santana is a 43 y.o. male with a history of hypertrophic cardiomyopathy.  He has been busier than usual for the past 3 days traveling and has not been sleeping well.  He checked his blood pressure throughout the day and found it to be elevated this evening.  He was having tingling in his left arm which she has had before but this caused him to become concerned.  He is not sure if that may have caused his blood pressure to go up.  He also had some mild upper chest discomfort earlier which lasted about an hour.  Nothing made his symptoms better or worse and there was no associated shortness of breath.  He is not currently having any tingling or chest discomfort.  He has had a cervical MRI in the past to evaluate his arm paresthesias but this was nondiagnostic.  Past Medical History:  Diagnosis Date  . 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 Atypical chest pain   . Elevated troponin level   . Essential hypertension     Past Surgical History:  Procedure Laterality Date  . left ear drum      Family History  Problem Relation Age of Onset  . Diabetes Mellitus II Mother   . Diabetes Mellitus II Father     Social History   Tobacco Use  . Smoking status: Never Smoker  . Smokeless tobacco: Never Used  Substance Use Topics  . Alcohol use: Yes  . Drug use: No    Prior to Admission medications   Medication Sig Start Date  End Date Taking? Authorizing Provider  aspirin EC 81 MG EC tablet Take 1 tablet (81 mg total) by mouth daily. 02/02/18   Osvaldo ShipperKrishnan, Gokul, MD  atorvastatin (LIPITOR) 40 MG tablet Take 1 tablet (40 mg total) by mouth daily at 6 PM. 04/06/18   Alben SpittleWeaver, Lorin PicketScott T, PA-C  carvedilol (COREG) 25 MG tablet Take 1 tablet (25 mg total) by mouth 2 (two) times daily with a meal. 04/06/18   Weaver, Lorin PicketScott T, PA-C  folic acid (FOLVITE) 1 MG tablet Take 1 mg by mouth daily.    [provider]  Multiple Vitamin (MULTIVITAMIN) tablet Take 1 tablet by mouth daily.    [provider]    Allergies Patient has no known allergies.   REVIEW OF SYSTEMS  Negative except as noted here or in the History of Present Illness.   PHYSICAL EXAMINATION  Initial Vital Signs Blood pressure (!) 177/115, pulse 62, resp. rate 19, height 5\' 10"  (1.778 m), weight 72 kg, SpO2 100 %.  Examination General: Well-developed, well-nourished male in no acute distress; appearance consistent with age of record HENT: normocephalic; atraumatic Eyes: pupils equal, round and reactive to light; extraocular muscles intact Neck: supple Heart: regular rate and rhythm; no murmur Lungs: clear to auscultation bilaterally Abdomen: soft; nondistended; nontender; bowel sounds present Extremities: No deformity; full range of motion; pulses normal Neurologic: Awake, alert and oriented; motor  function intact in all extremities and symmetric; no facial droop Skin: Warm and dry Psychiatric: Mildly anxious   RESULTS  Summary of this visit's results, reviewed by myself:   EKG Interpretation  Date/Time:  Wednesday June 27 2018 23:32:22 EST Ventricular Rate:  61 PR Interval:    QRS Duration: 86 QT Interval:  449 QTC Calculation: 453 R Axis:   64 Text Interpretation:  Sinus rhythm Probable left atrial enlargement Probable LVH with secondary repol abnrm Abnormal T, probable ischemia, lateral leads No significant change was found  Confirmed by Paula LibraMolpus, Annaka Cleaver ([id number]) on 06/27/2018 11:40:24 PM      Laboratory Studies: Results for orders placed or performed during the hospital encounter of 06/27/18 (from the past 24 hour(s))  Basic metabolic panel     Status: Abnormal   Collection Time: 06/27/18 11:35 PM  Result Value Ref Range   Sodium 135 135 - 145 mmol/L   Potassium 3.8 3.5 - 5.1 mmol/L   Chloride 103 98 - 111 mmol/L   CO2 24 22 - 32 mmol/L   Glucose, Bld 114 (H) 70 - 99 mg/dL   BUN 14 6 - 20 mg/dL   Creatinine, Ser 6.040.93 0.61 - 1.24 mg/dL   Calcium 9.1 8.9 - 54.010.3 mg/dL   GFR calc non Af Amer >60 >60 mL/min   GFR calc Af Amer >60 >60 mL/min   Anion gap 8 5 - 15  CBC     Status: None   Collection Time: 06/27/18 11:35 PM  Result Value Ref Range   WBC 8.0 4.0 - 10.5 K/uL   RBC 4.38 4.22 - 5.81 MIL/uL   Hemoglobin 14.0 13.0 - 17.0 g/dL   HCT 98.141.9 19.139.0 - 47.852.0 %   MCV 95.7 80.0 - 100.0 fL   MCH 32.0 26.0 - 34.0 pg   MCHC 33.4 30.0 - 36.0 g/dL   RDW 29.513.1 62.111.5 - 30.815.5 %   Platelets 261 150 - 400 K/uL   nRBC 0.0 0.0 - 0.2 %  Troponin I - ONCE - STAT     Status: None   Collection Time: 06/27/18 11:35 PM  Result Value Ref Range   Troponin I <0.03 <0.03 ng/mL  Troponin I - ONCE - STAT     Status: None   Collection Time: 06/28/18  2:31 AM  Result Value Ref Range   Troponin I <0.03 <0.03 ng/mL   Imaging Studies: Dg Chest 2 View  Result Date: 06/28/2018 CLINICAL DATA:  Chest pain EXAM: CHEST - 2 VIEW COMPARISON:  01/29/2018 chest radiograph. FINDINGS: Stable cardiomediastinal silhouette with normal heart size. No pneumothorax. No pleural effusion. Lungs appear clear, with no acute consolidative airspace disease and no pulmonary edema. IMPRESSION: No active cardiopulmonary disease. Electronically Signed   By: Delbert PhenixJason A Poff M.D.   On: 06/28/2018 01:15    ED COURSE and MDM  Nursing notes and initial vitals signs, including pulse oximetry, reviewed.  Vitals:   06/27/18 2332 06/28/18 0054 06/28/18 0203 06/28/18 0258    BP:  (!) 151/106 (!) 142/91 (!) 146/91  Pulse: 62 62 (!) 57 61  Resp: 19 20 14 16   SpO2: 100% 100% 99% 98%  Weight:      Height:       3:07 AM Patient continues to be asymptomatic.  Troponin negative x2.  EKG unchanged from previous EKGs.  Blood pressure improved after Ativan IV.  Anxiety may have played a part in its elevation earlier.  He has a follow-up appointment scheduled with Dr. Elease HashimotoNahser, his cardiologist,  on the 10th of this month.  PROCEDURES    ED DIAGNOSES     ICD-10-CM   1. Atypical chest pain R07.89   2. Paresthesia of left arm R20.2        Lachlan Pelto, Jonny Ruiz, MD 06/28/18 0308    Paula Libra, MD 06/28/18 [phone]

## 2018-06-27 NOTE — ED Triage Notes (Signed)
Pt states he has 3/10 chest discomfort with tingling on the left arm that started today, pt states he has a hx of Cardiomyopathy.

## 2018-06-27 NOTE — ED Notes (Signed)
Pt reports taking BP medication as prescribed- hypertensive all day

## 2018-06-28 DIAGNOSIS — R079 Chest pain, unspecified: Secondary | ICD-10-CM | POA: Diagnosis not present

## 2018-06-28 LAB — TROPONIN I
Troponin I: <0.03 ng/mL (ref <0.03)
Troponin I: <0.03 ng/mL (ref <0.03)

## 2018-06-28 MED ORDER — LORAZEPAM 2 MG/ML IJ SOLN
1.0000 mg | Freq: Once | INTRAMUSCULAR | Status: AC
  Administered 2018-06-28: 1 mg via INTRAVENOUS
  Filled 2018-06-28: qty 1

## 2018-07-06 ENCOUNTER — Ambulatory Visit: Payer: 59 | Admitting: Cardiovascular Disease

## 2018-07-06 ENCOUNTER — Encounter: Payer: Self-pay | Admitting: Cardiovascular Disease

## 2018-07-06 VITALS — BP 138/80 | HR 70 | Ht 70.0 in | Wt 159.0 lb

## 2018-07-06 DIAGNOSIS — I10 Essential (primary) hypertension: Secondary | ICD-10-CM | POA: Diagnosis not present

## 2018-07-06 DIAGNOSIS — I422 Other hypertrophic cardiomyopathy: Secondary | ICD-10-CM | POA: Diagnosis not present

## 2018-07-06 DIAGNOSIS — E785 Hyperlipidemia, unspecified: Secondary | ICD-10-CM

## 2018-07-06 MED ORDER — ATORVASTATIN CALCIUM 20 MG PO TABS: 20.0000 mg | ORAL_TABLET | Freq: Every day | ORAL | 3 refills | Status: DC

## 2018-07-06 MED ORDER — SPIRONOLACTONE 25 MG PO TABS: 12.5000 mg | ORAL_TABLET | Freq: Every day | ORAL | 3 refills | Status: DC

## 2018-07-06 NOTE — Progress Notes (Signed)
.  Cardiology Office Note:    Date:  07/06/2018   ID:  Patrick Santana, DOB 09/15/1975, MRN [id number]  PCP:  Kathyrn Lass, MD  Cardiologist:  Mertie Moores, MD  Electrophysiologist:  None   Referring MD: No ref. provider found   Chief Complaint  Patient presents with  . Chest Pain  . apical cardiomyopathy     Jan. 10, 2020    Patrick Santana is a 43 y.o. male who I met in the hospital in August, 2019.  He presented with atypical episodes of chest pain.  History of hypertension.  EKG reveals deep T wave inversions in the anterior leads. He ruled out for myocardial infarction.  Chest CT angiogram was negative for pulmonary embolus.  Echocardiogram was suggestive of apical hypertrophy.  Apical hypertrophy was confirmed with cardiac MRI. He has been seen by our genetic counselor, Dr. [name].  Genetic testing was performed.  Blood has been drawn but I do not see any results as of yet.  - Or a Holter monitor which revealed normal sinus rhythm.  He had no ventricular arrhythmias.  He was back in the ER on Jan. 1 with aypical CP Had been on vacation. BP was up a bit.   He became anxious  And went to the ER .  Work-up was negative.    Past Medical History:  Diagnosis Date  . 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 Atypical chest pain   . Elevated troponin level   . Essential hypertension     Past Surgical History:  Procedure Laterality Date  . left ear drum      Current Medications: Current Meds  Medication Sig  . aspirin EC 81 MG EC tablet Take 1 tablet (81 mg total) by mouth daily.  . carvedilol (COREG) 25 MG tablet  Take 1 tablet (25 mg total) by mouth 2 (two) times daily with a meal.  . folic acid (FOLVITE) 1 MG tablet Take 1 mg by mouth daily.  . Multiple Vitamin (MULTIVITAMIN) tablet Take 1 tablet by mouth daily.  . [DISCONTINUED] atorvastatin (LIPITOR) 40 MG tablet Take 1 tablet (40 mg total) by mouth daily at 6 PM.     Allergies:   Patient has no known allergies.   Social History   Socioeconomic History  . Marital status: Married    Spouse name: Not on file  . Number of children: Not on file  . Years of education: Not on file  . Highest education level: Not on file  Occupational History  . Not on file  Social Needs  . Financial resource strain: Not on file  . Food insecurity:    Worry: Not on file    Inability: Not on file  . Transportation needs:    Medical: Not on file    Non-medical: Not on file  Tobacco Use  . Smoking status: Never Smoker  . Smokeless tobacco: Never Used  Substance and Sexual Activity  . Alcohol use: Yes  . Drug use: No  . Sexual activity: Not on file  Lifestyle  . Physical activity:    Days per week: Not on file    Minutes per session: Not on  file  . Stress: Not on file  Relationships  . Social connections:    Talks on phone: Not on file    Gets together: Not on file    Attends religious service: Not on file    Active member of club or organization: Not on file    Attends meetings of clubs or organizations: Not on file    Relationship status: Not on file  Other Topics Concern  . Not on file  Social History Narrative  . Not on file     Family History: The patient's family history includes Diabetes Mellitus II in his father and mother.  ROS:   Please see the history of present illness.     All other systems reviewed and are negative.  EKGs/Labs/Other Studies Reviewed:    The following studies were reviewed today:   EKG:    Recent Labs: 01/30/2018: Magnesium 2.1 05/11/2018: ALT 38 06/27/2018: BUN 14; Creatinine, Ser 0.93; Hemoglobin 14.0;  Platelets 261; Potassium 3.8; Sodium 135  Recent Lipid Panel    Component Value Date/Time   CHOL 131 05/11/2018 0845   TRIG 125 05/11/2018 0845   HDL 57 05/11/2018 0845   CHOLHDL 2.3 05/11/2018 0845   CHOLHDL 3.5 01/30/2018 0335   VLDL 34 01/30/2018 0335   LDLCALC 49 05/11/2018 0845    Physical Exam:    VS:  BP 138/80   Pulse 70   Ht _0  (1.778 m)   Wt 159 lb (72.1 kg)   SpO2 98%   BMI 22.81 kg/m     Wt Readings from Last 3 Encounters:  07/06/18 159 lb (72.1 kg)  06/27/18 158 lb 11.7 oz (72 kg)  03/23/18 156 lb (70.8 kg)     GEN:  Well nourished, well developed in no acute distress HEENT: Normal NECK: No JVD; No carotid bruits LYMPHATICS: No lymphadenopathy CARDIAC: RRR, no murmurs, rubs, gallops RESPIRATORY:  Clear to auscultation without rales, wheezing or rhonchi  ABDOMEN: Soft, non-tender, non-distended MUSCULOSKELETAL:  No edema; No deformity  SKIN: Warm and dry NEUROLOGIC:  Alert and oriented x 3 PSYCHIATRIC:  Normal affect   ASSESSMENT:    1. Apical variant hypertrophic cardiomyopathy ([place])   2. Essential hypertension   3. Hyperlipidemia, unspecified hyperlipidemia type    PLAN:    In order of problems listed above:  1. Apical cardiomyopathy: The patient is basically asymptomatic.  Continue with carvedilol 25 mg twice a day.  He is wanted a Holter monitor and was not found to have any ventricular arrhythmias.  Had blood drawn for genetic testing and the results are pending.  He has been seen by our genetic counselor, Dr. [name].   2.  Hypertension: Continue carvedilol 25 mg twice a day.  His diastolic blood pressures remain mildly elevated.  We will add Aldactone 12.5 mg a day.  He will return for a nurse visit in approximately 3 weeks for a blood pressure check as well as a basic metabolic profile.  3.  Hyperlipidemia: His lipids were elevated when initially met him in August, 2019.  He has been on atorvastatin 40 mg a day and his LDL is in  the 40s.  He is interested in decreasing the atorvastatin if possible. We will decrease the atorvastatin to 20 mg a day.  We will anticipate getting a lipid panel, liver enzymes, basic metabolic profile in 6 months.   Medication Adjustments/Labs and Tests Ordered: Current medicines are reviewed at length with the patient today.  Concerns regarding medicines are  outlined above.  Orders Placed This Encounter  Procedures  . Basic Metabolic Panel (BMET)  . Lipid Profile  . Basic Metabolic Panel (BMET)  . Hepatic function panel   Meds ordered this encounter  Medications  . spironolactone (ALDACTONE) 25 MG tablet    Sig: Take 0.5 tablets (12.5 mg total) by mouth daily.    Dispense:  45 tablet    Refill:  3  . atorvastatin (LIPITOR) 20 MG tablet    Sig: Take 1 tablet (20 mg total) by mouth daily.    Dispense:  90 tablet    Refill:  3     Patient Instructions  Medication Instructions:  Your physician has recommended you make the following change in your medication:  DECREASE Atorvastatin to 20 mg once daily START Aldactone (Spironolactone) 12.5 mg (1/2 of 25 mg tab) once daily in the AM  If you need a refill on your cardiac medications before your next appointment, please call your pharmacy.    Lab work: Your physician recommends that you return for lab work on Friday January 31 at 11:30 am - you do not need to fast   Your physician recommends that you return for lab work in: 6 months on the day of or a few days before your office visit with Dr. Acie Fredrickson.  You will need to FAST for this appointment - nothing to eat or drink after midnight the night before except water.    Testing/Procedures: None Ordered   Follow-Up: Your physician recommends that you return for a follow-up appointment with Nurse Sharyn Lull for BP check on Friday January 31 at 11:30 am   At [facility], you and your health needs are our priority.  As part of our continuing mission to provide you with  exceptional heart care, we have created designated Provider Care Teams.  These Care Teams include your primary Cardiologist (physician) and Advanced Practice Providers (APPs -  Physician Assistants and Nurse Practitioners) who all work together to provide you with the care you need, when you need it. You will need a follow up appointment in:  6 months.  Please call our office 2 months in advance to schedule this appointment.  You may see Mertie Moores, MD or one of the following Advanced Practice Providers on your designated Care Team: Richardson Dopp, PA-C [place], [place] . Daune Perch, NP      Signed, Mertie Moores, MD  07/06/2018 11:31 AM    [place]

## 2018-07-06 NOTE — Patient Instructions (Signed)
Medication Instructions:  Your physician has recommended you make the following change in your medication:  DECREASE Atorvastatin to 20 mg once daily START Aldactone (Spironolactone) 12.5 mg (1/2 of 25 mg tab) once daily in the AM  If you need a refill on your cardiac medications before your next appointment, please call your pharmacy.    Lab work: Your physician recommends that you return for lab work on Friday January 31 at 11:30 am - you do not need to fast   Your physician recommends that you return for lab work in: 6 months on the day of or a few days before your office visit with Dr. Elease Hashimoto.  You will need to FAST for this appointment - nothing to eat or drink after midnight the night before except water.    Testing/Procedures: None Ordered   Follow-Up: Your physician recommends that you return for a follow-up appointment with Nurse Marcelino Duster for BP check on Friday January 31 at 11:30 am   At [facility], you and your health needs are our priority.  As part of our continuing mission to provide you with exceptional heart care, we have created designated Provider Care Teams.  These Care Teams include your primary Cardiologist (physician) and Advanced Practice Providers (APPs -  Physician Assistants and Nurse Practitioners) who all work together to provide you with the care you need, when you need it. You will need a follow up appointment in:  6 months.  Please call our office 2 months in advance to schedule this appointment.  You may see Kristeen Miss, MD or one of the following Advanced Practice Providers on your designated Care Team: Tereso Newcomer, PA-C [place], [place] . Berton Bon, NP

## 2018-07-26 ENCOUNTER — Ambulatory Visit: Payer: 59 | Admitting: Cardiovascular Disease

## 2018-07-26 DIAGNOSIS — I422 Other hypertrophic cardiomyopathy: Secondary | ICD-10-CM | POA: Diagnosis not present

## 2018-07-27 ENCOUNTER — Ambulatory Visit (INDEPENDENT_AMBULATORY_CARE_PROVIDER_SITE_OTHER): Payer: 59 | Admitting: Nurse Practitioner

## 2018-07-27 ENCOUNTER — Other Ambulatory Visit: Payer: 59 | Admitting: *Deleted

## 2018-07-27 VITALS — BP 104/62 | HR 64 | Resp 14 | Ht 70.0 in

## 2018-07-27 DIAGNOSIS — I10 Essential (primary) hypertension: Secondary | ICD-10-CM

## 2018-07-27 DIAGNOSIS — I422 Other hypertrophic cardiomyopathy: Secondary | ICD-10-CM

## 2018-07-27 DIAGNOSIS — E785 Hyperlipidemia, unspecified: Secondary | ICD-10-CM

## 2018-07-27 LAB — BASIC METABOLIC PANEL
BUN/Creatinine Ratio: 15 (ref 9–20)
BUN: 15 mg/dL (ref 6–24)
CALCIUM: 9.4 mg/dL (ref 8.7–10.2)
CHLORIDE: 103 mmol/L (ref 96–106)
CO2: 25 mmol/L (ref 20–29)
Creatinine, Ser: 0.97 mg/dL (ref 0.76–1.27)
GFR calc non Af Amer: 97 mL/min/{1.73_m2} (ref >59)
GFR, EST AFRICAN AMERICAN: 112 mL/min/{1.73_m2} (ref >59)
Glucose: 98 mg/dL (ref 65–99)
Potassium: 4.2 mmol/L (ref 3.5–5.2)
Sodium: 141 mmol/L (ref 134–144)

## 2018-07-27 NOTE — Progress Notes (Signed)
Reason for visit: [place] visit  1.) Name of MD requesting visit: Dr. Elease Hashimoto  2.) H&P: Patient presents for BP check and BMET since starting aldactone 12.5 mg on 07/06/18 office visit   3.) ROS related to problem: Patient denies complaints. He states he has noticed that his BP is lower, particularly the diastolic reading. He denies symptoms of fatigue, dizziness, or light-headedness. He has noticed mild orthostasis on occasion with rapid movement but he plays tennis regularly without any problems. I advised patient that hydration is important and he confirms that he will continue to drink 64 or more ounces daily, more when physically active.  4.) Assessment and plan per MD: continue current medications and schedule follow-up with Dr. Elease Hashimoto for July 2020. Patient asks about starting [employer] and was advised by Dr. Elease Hashimoto to progress his cardio exercise and weight lifting slowly on his own before starting a rigorous program. I advised patient to call back with concerns or symptoms associated with increased activity.

## 2018-07-27 NOTE — Patient Instructions (Addendum)
Medication Instructions:  Your physician recommends that you continue on your current medications as directed. Please refer to the Current Medication list given to you today.  If you need a refill on your cardiac medications before your next appointment, please call your pharmacy.   Lab work: BMET done today  If you have labs (blood work) drawn today and your tests are completely normal, you will receive your results only by: Marland Kitchen MyChart Message (if you have MyChart) OR . A paper copy in the mail If you have any lab test that is abnormal or we need to change your treatment, we will call you to review the results.   Testing/Procedures: None Ordered   Follow-Up: At [facility], you and your health needs are our priority.  As part of our continuing mission to provide you with exceptional heart care, we have created designated Provider Care Teams.  These Care Teams include your primary Cardiologist (physician) and Advanced Practice Providers (APPs -  Physician Assistants and Nurse Practitioners) who all work together to provide you with the care you need, when you need it. You will need a follow up appointment in:  6 months.  Please call our office 2 months in advance to schedule this appointment.  You may see Kristeen Miss, MD or one of the following Advanced Practice Providers on your designated Care Team: Tereso Newcomer, PA-C [place], [place] . Berton Bon, NP

## 2018-08-01 DIAGNOSIS — I422 Other hypertrophic cardiomyopathy: Secondary | ICD-10-CM | POA: Diagnosis not present

## 2018-08-02 DIAGNOSIS — I422 Other hypertrophic cardiomyopathy: Secondary | ICD-10-CM | POA: Diagnosis not present

## 2018-08-03 DIAGNOSIS — I422 Other hypertrophic cardiomyopathy: Secondary | ICD-10-CM | POA: Diagnosis not present

## 2018-08-04 DIAGNOSIS — I422 Other hypertrophic cardiomyopathy: Secondary | ICD-10-CM | POA: Diagnosis not present

## 2018-08-15 DIAGNOSIS — I10 Essential (primary) hypertension: Secondary | ICD-10-CM | POA: Diagnosis not present

## 2018-08-15 DIAGNOSIS — Z0000 Encounter for general adult medical examination without abnormal findings: Secondary | ICD-10-CM | POA: Diagnosis not present

## 2018-08-15 DIAGNOSIS — I422 Other hypertrophic cardiomyopathy: Secondary | ICD-10-CM | POA: Diagnosis not present

## 2018-08-15 DIAGNOSIS — Z23 Encounter for immunization: Secondary | ICD-10-CM | POA: Diagnosis not present

## 2018-12-06 ENCOUNTER — Ambulatory Visit: Payer: 59 | Admitting: Genetic Counselor

## 2018-12-06 ENCOUNTER — Other Ambulatory Visit: Payer: Self-pay

## 2018-12-13 NOTE — Progress Notes (Signed)
Due to the COVID-19 pandemic, patient consents to having a virtual genetic consult and for the visit details to be documented in his notes. Patient identity was confirmed using two unique identifiers of full name and date of birth   [name] Consultation notes  Patrick Santana is here today for his post-test genetic consult. We first reviewed his pedigree and he informs that there have been no changes in his medical history or that of his family members.   I informed Patrick Santana that he does not have a pathogenic variant for HCM. I explained the reasons as to why his genetic test was negative. Clinical testing is restricted to the coding regions of the gene thus precluding the regulatory control elements that may impact gene function and hence protein activity.   He was relieved to hear this result. 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 When he does Patrick to have children in the future, he can consider having his kids undergo periodic surveillance for HCM as this is an autosomal dominant condition. He verbalized understanding of this.  Additionally, he qualifies for the Cardiomyopathy Study at [facility] as he is phenotype positive for HCM but genotype negative. I reiterated that he does have a genetic condition and with increasing numbers of patients enrolling in this study, the knowledgebase will significantly improve our understanding thereby allowing future generations to be tested with a higher positive outcome. He verbalized understanding of this.  Please note that the patient has not undergone genetic counseling to cope emotionally with having a likely genetic heart condition.  Patrick Santana is interested in enrolling in the Cardiomyopathy Research study at [facility]. We can get him enrolled in this study once the HCM clinic opens up for face-to-face visits.    Patrick Santana, Ph.D, [facility] Clinical Molecular  Geneticist

## 2019-03-01 ENCOUNTER — Other Ambulatory Visit: Payer: 59

## 2019-03-05 ENCOUNTER — Ambulatory Visit: Payer: 59 | Admitting: Cardiovascular Disease

## 2019-03-18 ENCOUNTER — Other Ambulatory Visit: Payer: Self-pay | Admitting: Physician Assistant

## 2019-03-21 ENCOUNTER — Other Ambulatory Visit: Payer: Self-pay | Admitting: Cardiovascular Disease

## 2019-03-26 ENCOUNTER — Other Ambulatory Visit: Payer: 59

## 2019-03-26 ENCOUNTER — Other Ambulatory Visit: Payer: Self-pay

## 2019-03-26 DIAGNOSIS — I10 Essential (primary) hypertension: Secondary | ICD-10-CM

## 2019-03-26 DIAGNOSIS — E785 Hyperlipidemia, unspecified: Secondary | ICD-10-CM

## 2019-03-26 DIAGNOSIS — I422 Other hypertrophic cardiomyopathy: Secondary | ICD-10-CM

## 2019-03-26 LAB — BASIC METABOLIC PANEL
BUN/Creatinine Ratio: 12 (ref 9–20)
BUN: 12 mg/dL (ref 6–24)
CO2: 22 mmol/L (ref 20–29)
Calcium: 9.4 mg/dL (ref 8.7–10.2)
Chloride: 102 mmol/L (ref 96–106)
Creatinine, Ser: 1.03 mg/dL (ref 0.76–1.27)
GFR calc Af Amer: 104 mL/min/{1.73_m2} (ref >59)
GFR calc non Af Amer: 90 mL/min/{1.73_m2} (ref >59)
Glucose: 102 mg/dL — ABNORMAL HIGH (ref 65–99)
Potassium: 4.8 mmol/L (ref 3.5–5.2)
Sodium: 138 mmol/L (ref 134–144)

## 2019-03-26 LAB — HEPATIC FUNCTION PANEL
ALT: 84 IU/L — ABNORMAL HIGH (ref 0–44)
AST: 122 IU/L — ABNORMAL HIGH (ref 0–40)
Albumin: 4.5 g/dL (ref 4.0–5.0)
Alkaline Phosphatase: 110 IU/L (ref 39–117)
Bilirubin Total: 0.6 mg/dL (ref 0.0–1.2)
Bilirubin, Direct: 0.24 mg/dL (ref 0.00–0.40)
Total Protein: 7 g/dL (ref 6.0–8.5)

## 2019-03-26 LAB — LIPID PANEL
Chol/HDL Ratio: 2 ratio (ref 0.0–5.0)
Cholesterol, Total: 125 mg/dL (ref 100–199)
HDL: 61 mg/dL (ref >39)
LDL Chol Calc (NIH): 33 mg/dL (ref 0–99)
Triglycerides: 198 mg/dL — ABNORMAL HIGH (ref 0–149)
VLDL Cholesterol Cal: 31 mg/dL (ref 5–40)

## 2019-03-29 ENCOUNTER — Encounter: Payer: Self-pay | Admitting: Cardiovascular Disease

## 2019-03-29 ENCOUNTER — Ambulatory Visit: Payer: 59 | Admitting: Cardiovascular Disease

## 2019-03-29 ENCOUNTER — Other Ambulatory Visit: Payer: Self-pay

## 2019-03-29 VITALS — BP 130/84 | HR 82 | Ht 70.0 in | Wt 166.8 lb

## 2019-03-29 DIAGNOSIS — I10 Essential (primary) hypertension: Secondary | ICD-10-CM | POA: Diagnosis not present

## 2019-03-29 DIAGNOSIS — I422 Other hypertrophic cardiomyopathy: Secondary | ICD-10-CM | POA: Diagnosis not present

## 2019-03-29 DIAGNOSIS — E785 Hyperlipidemia, unspecified: Secondary | ICD-10-CM | POA: Diagnosis not present

## 2019-03-29 MED ORDER — SPIRONOLACTONE 25 MG PO TABS: 12.5000 mg | ORAL_TABLET | ORAL | 3 refills | Status: DC

## 2019-03-29 NOTE — Progress Notes (Signed)
.  Cardiology Office Note:    Date:  03/29/2019   ID:  Caesar Chestnut, DOB 1976/06/21, MRN [id number]  PCP:  Kathyrn Lass, MD  Cardiologist:  Mertie Moores, MD  Electrophysiologist:  None   Referring MD: Kathyrn Lass, MD   Chief Complaint  Patient presents with  . Abnormal ECG     Jan. 10, 2020    Patrick Santana is a 43 y.o. male who I met in the hospital in August, 2019.  He presented with atypical episodes of chest pain.  History of hypertension.  EKG reveals deep T wave inversions in the anterior leads. He ruled out for myocardial infarction.  Chest CT angiogram was negative for pulmonary embolus.  Echocardiogram was suggestive of apical hypertrophy.  Apical hypertrophy was confirmed with cardiac MRI. He has been seen by our genetic counselor, Dr. [name].  Genetic testing was performed.  Blood has been drawn but I do not see any results as of yet.  - Or a Holter monitor which revealed normal sinus rhythm.  He had no ventricular arrhythmias.  He was back in the ER on Jan. 1 with aypical CP Had been on vacation. BP was up a bit.   He became anxious  And went to the ER .  Work-up was negative.  Oct. 2, 2020   Has met with Delma Freeze.   He does not need an ICD  BP is well controlled . Working out , no CP ,   Hx of  hyperlipidemia   Past Medical History:  Diagnosis Date  . 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 Atypical chest pain   . Elevated troponin level   . Essential hypertension     Past Surgical History:  Procedure Laterality Date  . left ear drum      Current Medications: Current Meds  Medication  Sig  . aspirin EC 81 MG EC tablet Take 1 tablet (81 mg total) by mouth daily.  . carvedilol (COREG) 25 MG tablet TAKE 1 TABLET (25 MG TOTAL) BY MOUTH 2 (TWO) TIMES DAILY WITH A MEAL.  . folic acid (FOLVITE) 1 MG tablet Take 1 mg by mouth daily.  . Multiple Vitamin (MULTIVITAMIN) tablet Take 1 tablet by mouth daily.  . [DISCONTINUED] atorvastatin (LIPITOR) 20 MG tablet Take 1 tablet (20 mg total) by mouth daily.  . [DISCONTINUED] spironolactone (ALDACTONE) 25 MG tablet Take 0.5 tablets (12.5 mg total) by mouth daily.     Allergies:   Patient has no known allergies.   Social History   Socioeconomic History  . Marital status: Married    Spouse name: Not on file  . Number of children: Not on file  . Years of education: Not on file  . Highest education level: Not on file  Occupational History  . Not on file  Social Needs  . Financial resource strain: Not on file  . Food insecurity    Worry: Not on file    Inability: Not on file  . Transportation needs    Medical: Not on file    Non-medical: Not on file  Tobacco Use  . Smoking status: Never Smoker  . Smokeless tobacco: Never Used  Substance and Sexual Activity  . Alcohol use: Yes  . Drug use: No  . Sexual activity: Not on file  Lifestyle  . Physical activity    Days per week: Not on file    Minutes per session: Not on file  . Stress: Not on file  Relationships  . Social Herbalist on phone: Not on file    Gets together: Not on file    Attends religious service: Not on file    Active member of club or organization: Not on file    Attends meetings of clubs or organizations: Not on file    Relationship status: Not on file  Other Topics Concern  . Not on file  Social History Narrative  . Not on file     Family History: The patient's family history includes Diabetes Mellitus II in his father and mother.  ROS:   Please see the history of present illness.     All other systems reviewed and are negative.   EKGs/Labs/Other Studies Reviewed:    The following studies were reviewed today:   EKG:    Recent Labs: 06/27/2018: Hemoglobin 14.0; Platelets 261 03/26/2019: ALT 84; BUN 12; Creatinine, Ser 1.03; Potassium 4.8; Sodium 138  Recent Lipid Panel    Component Value Date/Time   CHOL 125 03/26/2019 0922   TRIG 198 (H) 03/26/2019 0922   HDL 61 03/26/2019 0922   CHOLHDL 2.0 03/26/2019 0922   CHOLHDL 3.5 01/30/2018 0335   VLDL 34 01/30/2018 0335   LDLCALC 33 03/26/2019 0922    Physical Exam: Blood pressure 130/84, pulse 82, height '5\' 10"'  (1.778 m), weight 166 lb 12.8 oz (75.7 kg), SpO2 99 %.  GEN:  Well nourished, well developed in no acute distress HEENT: Normal NECK: No JVD; No carotid bruits LYMPHATICS: No lymphadenopathy CARDIAC: RRR, soft systolic murmur   RESPIRATORY:  Clear to auscultation without rales, wheezing or rhonchi  ABDOMEN: Soft, non-tender, non-distended MUSCULOSKELETAL:  No edema; No deformity  SKIN: Warm and dry NEUROLOGIC:  Alert and oriented x 3   ASSESSMENT:    1. Apical variant hypertrophic cardiomyopathy ([place])   2. Essential hypertension   3. Hyperlipidemia, unspecified hyperlipidemia type    PLAN:    In order of problems listed above:  Apical cardiomyopathy:   Had blood drawn for genetic testing and the results are pending.  He has been seen by our genetic counselor, Dr. [name].     2.  Hypertension: Blood pressure is well controlled and in fact is a little low at times.  He is on spironolactone 12.5 mg a day.  We will reduce the spironolactone to 12.5 mg every other day.  Continue carvedilol for his apical hypertrophy.  3.  Hyperlipidemia:  His LFTs are elevated to the point where we need to stop  His statin.   Will recheck in 3 months.  Consider referal to lipid clinic if needed.    Medication Adjustments/Labs and Tests Ordered: Current medicines are reviewed at length with the patient today.  Concerns regarding medicines are outlined  above.  Orders Placed This Encounter  Procedures  . Lipid Profile  . Basic Metabolic Panel (BMET)  . Hepatic function panel   Meds ordered this encounter  Medications  . spironolactone (ALDACTONE) 25 MG tablet    Sig: Take 0.5 tablets (12.5 mg total) by mouth every other day.    Dispense:  15 tablet    Refill:  3     Patient Instructions  Medication Instructions:  Your physician has recommended you make the following change in your medication:  DECREASE Spironolactone to 12.5 mg (1/2 tab) every other day  If you need a refill on your cardiac medications before your next appointment, please call your pharmacy.    Lab work: Your physician recommends that you return for lab work in: 3 months on Wed. January 6. You may come in anytime after 7;30 am and before 4:45 pm  You will need to FAST for this appointment - nothing to eat or drink after midnight the night before except water.    Testing/Procedures: None Ordered    Follow-Up: At [facility], you and your health needs are our priority.  As part of our continuing mission to provide you with exceptional heart care, we have created designated Provider Care Teams.  These Care Teams include your primary Cardiologist (physician) and Advanced Practice Providers (APPs -  Physician Assistants and Nurse Practitioners) who all work together to provide you with the care you need, when you need it. You will need a follow up appointment in:  6 months.  Please call our office 2 months in advance to schedule this appointment.  You may see Mertie Moores, MD or one of the following Advanced Practice Providers on your designated Care Team: Richardson Dopp, PA-C [place], [place] . Daune Perch, NP      Signed, Mertie Moores, MD  03/29/2019 5:35 PM    [place]

## 2019-03-29 NOTE — Patient Instructions (Addendum)
Medication Instructions:  Your physician has recommended you make the following change in your medication:  DECREASE Spironolactone to 12.5 mg (1/2 tab) every other day  If you need a refill on your cardiac medications before your next appointment, please call your pharmacy.    Lab work: Your physician recommends that you return for lab work in: 3 months on Wed. January 6. You may come in anytime after 7;30 am and before 4:45 pm  You will need to FAST for this appointment - nothing to eat or drink after midnight the night before except water.    Testing/Procedures: None Ordered    Follow-Up: At [facility], you and your health needs are our priority.  As part of our continuing mission to provide you with exceptional heart care, we have created designated Provider Care Teams.  These Care Teams include your primary Cardiologist (physician) and Advanced Practice Providers (APPs -  Physician Assistants and Nurse Practitioners) who all work together to provide you with the care you need, when you need it. You will need a follow up appointment in:  6 months.  Please call our office 2 months in advance to schedule this appointment.  You may see Mertie Moores, MD or one of the following Advanced Practice Providers on your designated Care Team: Richardson Dopp, PA-C [place], [place] . Daune Perch, NP

## 2019-04-23 ENCOUNTER — Other Ambulatory Visit: Payer: Self-pay | Admitting: Cardiovascular Disease

## 2019-06-09 ENCOUNTER — Other Ambulatory Visit: Payer: Self-pay | Admitting: Cardiovascular Disease

## 2019-07-03 ENCOUNTER — Other Ambulatory Visit: Payer: 59

## 2019-07-19 ENCOUNTER — Other Ambulatory Visit: Payer: 59

## 2019-07-26 ENCOUNTER — Other Ambulatory Visit: Payer: 59

## 2019-08-16 ENCOUNTER — Other Ambulatory Visit: Payer: 59

## 2019-08-30 ENCOUNTER — Other Ambulatory Visit: Payer: 59

## 2019-09-11 ENCOUNTER — Other Ambulatory Visit: Payer: 59

## 2019-09-16 ENCOUNTER — Other Ambulatory Visit: Payer: Self-pay

## 2019-09-16 ENCOUNTER — Other Ambulatory Visit: Payer: 59 | Admitting: *Deleted

## 2019-09-16 DIAGNOSIS — E785 Hyperlipidemia, unspecified: Secondary | ICD-10-CM

## 2019-09-16 DIAGNOSIS — I422 Other hypertrophic cardiomyopathy: Secondary | ICD-10-CM

## 2019-09-16 DIAGNOSIS — I10 Essential (primary) hypertension: Secondary | ICD-10-CM

## 2019-09-16 LAB — HEPATIC FUNCTION PANEL
ALT: 21 IU/L (ref 0–44)
AST: 28 IU/L (ref 0–40)
Albumin: 4.5 g/dL (ref 4.0–5.0)
Alkaline Phosphatase: 70 IU/L (ref 39–117)
Bilirubin Total: 0.6 mg/dL (ref 0.0–1.2)
Bilirubin, Direct: 0.19 mg/dL (ref 0.00–0.40)
Total Protein: 6.6 g/dL (ref 6.0–8.5)

## 2019-09-16 LAB — BASIC METABOLIC PANEL
BUN/Creatinine Ratio: 17 (ref 9–20)
BUN: 16 mg/dL (ref 6–24)
CO2: 24 mmol/L (ref 20–29)
Calcium: 9.3 mg/dL (ref 8.7–10.2)
Chloride: 101 mmol/L (ref 96–106)
Creatinine, Ser: 0.96 mg/dL (ref 0.76–1.27)
GFR calc Af Amer: 112 mL/min/{1.73_m2} (ref >59)
GFR calc non Af Amer: 97 mL/min/{1.73_m2} (ref >59)
Glucose: 91 mg/dL (ref 65–99)
Potassium: 4.2 mmol/L (ref 3.5–5.2)
Sodium: 139 mmol/L (ref 134–144)

## 2019-09-16 LAB — LIPID PANEL
Chol/HDL Ratio: 2.5 ratio (ref 0.0–5.0)
Cholesterol, Total: 128 mg/dL (ref 100–199)
HDL: 51 mg/dL (ref >39)
LDL Chol Calc (NIH): 56 mg/dL (ref 0–99)
Triglycerides: 119 mg/dL (ref 0–149)
VLDL Cholesterol Cal: 21 mg/dL (ref 5–40)

## 2019-09-16 NOTE — Progress Notes (Addendum)
Cardiology Office Note  Date: 09/17/2019   ID: Nivek Powley, DOB October 02, 1975, MRN [id number]  PCP:  Kathyrn Lass, MD  Cardiologist:  Mertie Moores, MD Electrophysiologist:  None   Chief Complaint: Follow-up, apical variant hypertrophic cardiomyopathy.  HTN, HLD  History of Present Illness: Jamin Panther is a 44 y.o. 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  Seen in the past by genetic counselor Dr. [name] 11/2018.  Genetic testing was performed.  At last visit  with Dr. Acie Fredrickson his blood pressure was under control on spironolactone 12.5 mg daily but was slightly on the low side.  Spironolactone was reduced to every other day.  Patient's LFTs were elevated to the point where there was need to stop statins. Dr. Acie Fredrickson was considering referral to lipid clinic if needed.   Patient met with Dr. [name] December 06, 2018.  His genetic test was negative and he did not have a pathogenic variant for HCM.    Patient currently states he is feeling much better.  He has actively changed his lifestyle and adopting a vegetarian diet with some proteins added.  He states his recent lab work was much better without any statin medication.  He is actively exercising several times a week.  He denies any progressive anginal or exertional symptoms, palpitations or arrhythmias, presyncopal or syncopal episodes, PND or orthopnea.  He states he believes that his previous blood pressures were associated with anxiety issues over discovery of cardiomyopathy in addition to anxiety and stress related job issues.  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  He states the elevated blood pressures always appeared to be related to times of stress.  He regularly checks his blood pressures at home at least twice a day.  States his blood pressures usually run in the 120s over 70s.  He admits to some fatigue which he  believes is associated with a slow heart rate.  He believes he likely does not need spironolactone at this point given his low blood pressures and is questioning whether he needs to full 25 mg of carvedilol he is currently taking.  He states his resting heart rate sometimes dips into the low 50s range.  Past Medical History:  Diagnosis Date  . Apical variant hypertrophic cardiomyopathy ([place]) 03/23/2018   Echo 8/19: Moderate concentric LVH of the apex-consistent with apical variant hypertrophic cardiomyopathy, EF 65-70, normal wall motion, grade 1 diastolic dysfunction, trivial AI // Cardiac MRI 8/19:  Mod LVH basal and mid segments, severe LVH of apical segments, no LGE, EF 64 - c/w apical HCM, no high risk features  . Atypical chest pain   . Elevated troponin level   . Essential hypertension     Past Surgical History:  Procedure Laterality Date  . left ear drum      Current Outpatient Medications  Medication Sig Dispense Refill  . aspirin EC 81 MG EC tablet Take 1 tablet (81 mg total) by mouth daily. 30 tablet 1  . carvedilol (COREG) 25 MG tablet TAKE 1 TABLET (25 MG TOTAL) BY MOUTH 2 (TWO) TIMES DAILY WITH A MEAL. 180 tablet 0   No current facility-administered medications for this visit.   Allergies:  Patient has no known allergies.   Social History: The patient  reports that he has never smoked. He has never used smokeless tobacco. He reports current alcohol use. He reports that he does not use drugs.  Family History: The patient's family history includes Diabetes Mellitus II in his father and mother.   ROS:  Please see the history of present illness. Otherwise, complete review of systems is positive for none.  All other systems are reviewed and negative.    Physical Exam: VS:  BP 100/68   Pulse 62   Ht _0  (1.778 m)   Wt 158 lb 12.8 oz (72 kg)   SpO2 98%   BMI 22.79 kg/m , BMI Body mass index is 22.79 kg/m.  Wt Readings from Last 3 Encounters:  09/17/19 158 lb 12.8  oz (72 kg)  03/29/19 166 lb 12.8 oz (75.7 kg)  07/06/18 159 lb (72.1 kg)    General: Patient appears comfortable at rest. Neck: Supple, no elevated JVP or carotid bruits, no thyromegaly. Lungs: Clear to auscultation, nonlabored breathing at rest. Cardiac: Regular rate and rhythm, no S3 or significant systolic murmur, no pericardial rub. Extremities: No pitting edema, distal pulses 2+. Skin: Warm and dry. Musculoskeletal: No kyphosis. Neuropsychiatric: Alert and oriented x3, affect grossly appropriate.  ECG:  An ECG dated 09/17/2019 was personally reviewed today and demonstrated:  Sinus rhythm rate of 62 T wave inversions noted inferior leads and anterolateral leads.  Normal axis, voltage criteria for LVH.  Unchanged from previous EKG  Recent Labwork: 09/16/2019: ALT 21; AST 28; BUN 16; Creatinine, Ser 0.96; Potassium 4.2; Sodium 139     Component Value Date/Time   CHOL 128 09/16/2019 0922   TRIG 119 09/16/2019 0922   HDL 51 09/16/2019 0922   CHOLHDL 2.5 09/16/2019 0922   CHOLHDL 3.5 01/30/2018 0335   VLDL 34 01/30/2018 0335   LDLCALC 56 09/16/2019 0922    Other Studies Reviewed Today:  Cardiac MRI 01/31/2018 FINDINGS: 1. Normal left ventricular size, with moderate hypertrophy of the basal and mid segments and severe hypertrophy of the apical segments with hyperdynamic systolic function (LVEF = 69%). There are no regional wall motion abnormalities.  There is no late gadolinium enhancement in the left ventricular myocardium and no apical blood trapping or aneurysm.  LVEDD: 43 mm  LVESD: 24 mm  LVEDV: 110 ml  LVESV: 34 ml  SV: 76 ml  CO: 4.3 L/min  Myocardial mass: 166 g  2. Normal right ventricular size, thickness and systolic function (LVEF = 64%). There are no regional wall motion abnormalities.  3.  Normal left and right atrial size.  4. Normal size of the aortic root, ascending aorta and pulmonary artery.  5.  No significant valvular  abnormalities.  6.  Normal pericardium.  Mild circumferential pericardial effusion.  IMPRESSION: 1. Normal left ventricular size, with moderate hypertrophy of the basal and mid segments and severe hypertrophy of the apical segments with hyperdynamic systolic function (LVEF = 69%). There are no regional wall motion abnormalities.  There is no late gadolinium enhancement in the left ventricular myocardium and no apical blood trapping or aneurysm.  2. Normal right ventricular size, thickness and systolic function (LVEF = 64%). There are no regional wall motion abnormalities.  3.  Normal left and right atrial size.  4. Normal size of the aortic root, ascending aorta and pulmonary artery.  5.  No significant valvular abnormalities.  6.  Normal pericardium.  Mild circumferential pericardial effusion.  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  Assessment and Plan:  1. Apical variant hypertrophic cardiomyopathy ([place])   2. Essential hypertension   3. Hyperlipidemia, unspecified hyperlipidemia type  1. Apical variant hypertrophic cardiomyopathy ([place]) Patient has nonpathogenic apical variant hypertrophic cardiomyopathy.  He met with Dr. Horald Pollen molecular geneticist in June 2020.  Dr. Broadus John explained in the absence of family history of HCM or sudden death, it was likely patient had a de novo variant.  He was advised to have his children undergo periodic surveillance for HCM due to condition being autosomal dominant.  Patient denies any anginal or exertional symptoms.  He is exercising several times a week without any issues.  2. Essential hypertension Blood pressure is 100/68 today.  Patient states he would like to stop spironolactone if possible.  He also states he has some fatigue which he attributes to the carvedilol.  He states his resting heart rate sometimes decreases to the 50s.  He  believes the dosage for the carvedilol may be too high for him.  He regularly checks his blood pressures and states blood pressures usually run in the range of 120s over 70s to 80s.  Stop spironolactone.  Check your blood pressures for several weeks and call back with a report of your blood pressures and resting heart rate over the next few weeks.  If still feeling fatigued and slow heart rate we may adjust carvedilol at that time.  Patient states he believes his more active lifestyle and working out has contributed to better blood pressures over time.  We will plan to adjust carvedilol dosage dependent on result of blood pressures and heart rates and patient's wish to decrease the dosage due to low resting heart rates and some exertional fatigue which he believes is associated with low heart rate.  3. Hyperlipidemia, unspecified hyperlipidemia type Fasting lipid profile drawn 09/16/2019: TC 128, TRIG 119, HDL 51, LDL 56, AST 28, ALT 21.  Patient states he has been diligently practicing a healthy lifestyle with better eating habits adopting basically a vegetarian diet with some proteins included.  Fasting lipid profile and LFTs have significantly improved with lifestyle changes alone.  He had formerly been on statin medication which was stopped due to elevated liver function.  Medication Adjustments/Labs and Tests Ordered: Current medicines are reviewed at length with the patient today.  Concerns regarding medicines are outlined above.   Disposition: Follow-up with Dr. Acie Fredrickson or APP in 6 months  Signed, Levell July, NP 09/17/2019 1:01 PM    [facility]

## 2019-09-17 ENCOUNTER — Encounter: Payer: Self-pay | Admitting: Family Medicine

## 2019-09-17 ENCOUNTER — Ambulatory Visit: Payer: 59 | Admitting: Family Medicine

## 2019-09-17 VITALS — BP 100/68 | HR 62 | Ht 70.0 in | Wt 158.8 lb

## 2019-09-17 DIAGNOSIS — E785 Hyperlipidemia, unspecified: Secondary | ICD-10-CM

## 2019-09-17 DIAGNOSIS — I422 Other hypertrophic cardiomyopathy: Secondary | ICD-10-CM

## 2019-09-17 DIAGNOSIS — I10 Essential (primary) hypertension: Secondary | ICD-10-CM

## 2019-09-17 NOTE — Patient Instructions (Signed)
Medication Instructions:   Your physician has recommended you make the following change in your medication:   1) Stop Spironolactone  *If you need a refill on your cardiac medications before your next appointment, please call your pharmacy*  Lab Work:  None ordered today  Testing/Procedures:  None ordered today  Follow-Up: At [facility], you and your health needs are our priority.  As part of our continuing mission to provide you with exceptional heart care, we have created designated Provider Care Teams.  These Care Teams include your primary Cardiologist (physician) and Advanced Practice Providers (APPs -  Physician Assistants and Nurse Practitioners) who all work together to provide you with the care you need, when you need it.  We recommend signing up for the patient portal called "MyChart".  Sign up information is provided on this After Visit Summary.  MyChart is used to connect with patients for Virtual Visits (Telemedicine).  Patients are able to view lab/test results, encounter notes, upcoming appointments, etc.  Non-urgent messages can be sent to your provider as well.   To learn more about what you can do with MyChart, go to [email or web address].    Your next appointment:   6 month(s)  The format for your next appointment:   In Person  Provider:    You may see Kristeen Miss, MD or Tereso Newcomer, PA-C  Other Instructions  Monitor your blood pressure, fatigue, and activity intolerance for the next two weeks and send readings and send update through mychart.

## 2019-10-01 ENCOUNTER — Other Ambulatory Visit: Payer: 59

## 2019-10-03 ENCOUNTER — Ambulatory Visit (INDEPENDENT_AMBULATORY_CARE_PROVIDER_SITE_OTHER): Payer: 59 | Admitting: Otolaryngology

## 2019-10-03 ENCOUNTER — Encounter (INDEPENDENT_AMBULATORY_CARE_PROVIDER_SITE_OTHER): Payer: Self-pay | Admitting: Otolaryngology

## 2019-10-03 ENCOUNTER — Other Ambulatory Visit: Payer: Self-pay

## 2019-10-03 VITALS — Temp 98.1°F

## 2019-10-03 DIAGNOSIS — H6123 Impacted cerumen, bilateral: Secondary | ICD-10-CM

## 2019-10-03 NOTE — Progress Notes (Signed)
HPI: Patrick Santana is a 44 y.o. male who presents for evaluation of wax buildup in his ears.  He is status post a left tympanoplasty performed in [place] prior to moving to the [place].  More recently has noticed blockage of his ears especially on the right side.  Past Medical History:  Diagnosis Date  . Apical variant hypertrophic cardiomyopathy ([place]) 03/23/2018   Echo 8/19: Moderate concentric LVH of the apex-consistent with apical variant hypertrophic cardiomyopathy, EF 65-70, normal wall motion, grade 1 diastolic dysfunction, trivial AI // Cardiac MRI 8/19:  Mod LVH basal and mid segments, severe LVH of apical segments, no LGE, EF 64 - c/w apical HCM, no high risk features  . Atypical chest pain   . Elevated troponin level   . Essential hypertension    Past Surgical History:  Procedure Laterality Date  . left ear drum     Social History   Socioeconomic History  . Marital status: Married    Spouse name: Not on file  . Number of children: Not on file  . Years of education: Not on file  . Highest education level: Not on file  Occupational History  . Not on file  Tobacco Use  . Smoking status: Never Smoker  . Smokeless tobacco: Never Used  Substance and Sexual Activity  . Alcohol use: Yes  . Drug use: No  . Sexual activity: Not on file  Other Topics Concern  . Not on file  Social History Narrative  . Not on file   Social Determinants of Health   Financial Resource Strain:   . Difficulty of Paying Living Expenses:   Food Insecurity:   . Worried About Charity fundraiser in the Last Year:   . Arboriculturist in the Last Year:   Transportation Needs:   . Film/video editor (Medical):   Marland Kitchen Lack of Transportation (Non-Medical):   Physical Activity:   . Days of Exercise per Week:   . Minutes of Exercise per Session:   Stress:   . Feeling of Stress :   Social Connections:   . Frequency of Communication with Friends and Family:   . Frequency of Social Gatherings with  Friends and Family:   . Attends Religious Services:   . Active Member of Clubs or Organizations:   . Attends Archivist Meetings:   Marland Kitchen Marital Status:    Family History  Problem Relation Age of Onset  . Diabetes Mellitus II Mother   . Diabetes Mellitus II Father    No Known Allergies Prior to Admission medications   Medication Sig Start Date End Date Taking? Authorizing Provider  aspirin EC 81 MG EC tablet Take 1 tablet (81 mg total) by mouth daily. 02/02/18  Yes Bonnielee Haff, MD  carvedilol (COREG) 25 MG tablet TAKE 1 TABLET (25 MG TOTAL) BY MOUTH 2 (TWO) TIMES DAILY WITH A MEAL. 06/10/19  Yes Nahser, Wonda Cheng, MD     Positive ROS: Otherwise negative  All other systems have been reviewed and were otherwise negative with the exception of those mentioned in the HPI and as above.  Physical Exam: Constitutional: Alert, well-appearing, no acute distress Ears: External ears without lesions or tenderness. Ear canals right ear canal is completely occluded with cerumen that was cleaned with suction and hydrogen peroxide.  The right TM is clear.  Left TM had a small amount of wax that was cleaned with suction and curettes.  Left TM was clear.. Nasal: External nose without lesions.  Clear nasal passages Oral: Oropharynx clear. Neck: No palpable adenopathy or masses Respiratory: Breathing comfortably  Skin: No facial/neck lesions or rash noted.  Cerumen impaction removal  Date/Time: 10/03/2019 3:54 PM Performed by: Drema Halon, MD Authorized by: Drema Halon, MD   Consent:    Consent obtained:  Verbal   Consent given by:  Patient   Risks discussed:  Pain and bleeding Procedure details:    Location:  L ear and R ear   Procedure type: curette and suction   Post-procedure details:    Inspection:  TM intact and canal normal   Hearing quality:  Improved   Patient tolerance of procedure:  Tolerated well, no immediate complications Comments:     TMs are clear  bilaterally.    Assessment: Bilateral cerumen impactions  Plan: He will follow-up as needed.  Narda Bonds, MD

## 2019-12-25 ENCOUNTER — Other Ambulatory Visit: Payer: Self-pay | Admitting: Cardiovascular Disease

## 2020-07-24 ENCOUNTER — Telehealth: Payer: Self-pay | Admitting: Cardiovascular Disease

## 2020-07-24 DIAGNOSIS — E785 Hyperlipidemia, unspecified: Secondary | ICD-10-CM

## 2020-07-24 DIAGNOSIS — I422 Other hypertrophic cardiomyopathy: Secondary | ICD-10-CM

## 2020-07-24 DIAGNOSIS — I10 Essential (primary) hypertension: Secondary | ICD-10-CM

## 2020-07-24 DIAGNOSIS — Z79899 Other long term (current) drug therapy: Secondary | ICD-10-CM

## 2020-07-24 NOTE — Telephone Encounter (Signed)
    Pt made an appt in March, he wanted to ask Dr. Elease Hashimoto if he needs blood work before seeing him on his appt.

## 2020-07-24 NOTE — Telephone Encounter (Signed)
He will need a bmp. He may have it drawn several days prior to his office visit

## 2020-07-27 NOTE — Telephone Encounter (Signed)
Spoke with the pt.  He will come in for lab appt on 09/17/20 to check a BMET, and see Dr. Elease Hashimoto as scheduled for 3/28, as advised by Dr. Elease Hashimoto. Pt verbalized understanding and agrees with this plan.

## 2020-09-17 ENCOUNTER — Other Ambulatory Visit: Payer: 59

## 2020-09-18 ENCOUNTER — Other Ambulatory Visit: Payer: Self-pay | Admitting: Cardiovascular Disease

## 2020-09-21 ENCOUNTER — Ambulatory Visit: Payer: 59 | Admitting: Cardiovascular Disease

## 2020-10-05 ENCOUNTER — Other Ambulatory Visit: Payer: 59

## 2020-10-07 ENCOUNTER — Ambulatory Visit: Payer: 59 | Admitting: Cardiovascular Disease

## 2020-10-16 ENCOUNTER — Other Ambulatory Visit: Payer: Self-pay

## 2020-10-20 ENCOUNTER — Ambulatory Visit: Payer: Self-pay | Admitting: Cardiovascular Disease

## 2020-12-17 ENCOUNTER — Other Ambulatory Visit: Payer: Self-pay | Admitting: Cardiovascular Disease

## 2021-01-04 ENCOUNTER — Other Ambulatory Visit: Payer: Self-pay

## 2021-01-06 ENCOUNTER — Ambulatory Visit: Payer: Self-pay | Admitting: Cardiovascular Disease

## 2021-01-11 ENCOUNTER — Other Ambulatory Visit: Payer: Self-pay

## 2021-01-13 ENCOUNTER — Ambulatory Visit: Payer: BC Managed Care – PPO | Admitting: Cardiovascular Disease

## 2021-01-19 ENCOUNTER — Other Ambulatory Visit: Payer: Self-pay | Admitting: Cardiovascular Disease

## 2021-02-02 ENCOUNTER — Other Ambulatory Visit: Payer: Self-pay | Admitting: Cardiovascular Disease

## 2021-02-04 ENCOUNTER — Other Ambulatory Visit: Payer: Self-pay

## 2021-02-08 ENCOUNTER — Ambulatory Visit (HOSPITAL_BASED_OUTPATIENT_CLINIC_OR_DEPARTMENT_OTHER): Payer: BC Managed Care – PPO | Admitting: Family

## 2021-02-28 ENCOUNTER — Other Ambulatory Visit: Payer: Self-pay | Admitting: Cardiovascular Disease

## 2021-03-09 ENCOUNTER — Other Ambulatory Visit: Payer: Self-pay

## 2021-03-11 ENCOUNTER — Ambulatory Visit (HOSPITAL_BASED_OUTPATIENT_CLINIC_OR_DEPARTMENT_OTHER): Payer: BC Managed Care – PPO | Admitting: Family

## 2021-03-20 ENCOUNTER — Other Ambulatory Visit: Payer: Self-pay | Admitting: Cardiovascular Disease

## 2021-04-07 ENCOUNTER — Other Ambulatory Visit: Payer: Self-pay | Admitting: Cardiovascular Disease

## 2021-04-12 ENCOUNTER — Other Ambulatory Visit: Payer: Self-pay

## 2021-04-14 ENCOUNTER — Ambulatory Visit (HOSPITAL_BASED_OUTPATIENT_CLINIC_OR_DEPARTMENT_OTHER): Payer: BC Managed Care – PPO | Admitting: Family

## 2021-04-15 ENCOUNTER — Other Ambulatory Visit: Payer: Self-pay

## 2021-04-16 ENCOUNTER — Other Ambulatory Visit: Payer: Self-pay | Admitting: Cardiovascular Disease

## 2021-04-16 MED ORDER — CARVEDILOL 25 MG PO TABS: 25.0000 mg | ORAL_TABLET | Freq: Two times a day (BID) | ORAL | 0 refills | Status: DC

## 2021-04-24 ENCOUNTER — Other Ambulatory Visit: Payer: Self-pay | Admitting: Cardiovascular Disease

## 2021-04-25 ENCOUNTER — Other Ambulatory Visit: Payer: Self-pay

## 2021-04-27 MED ORDER — CARVEDILOL 25 MG PO TABS: 25.0000 mg | ORAL_TABLET | Freq: Two times a day (BID) | ORAL | 0 refills | Status: DC

## 2021-04-30 ENCOUNTER — Encounter (HOSPITAL_BASED_OUTPATIENT_CLINIC_OR_DEPARTMENT_OTHER): Payer: Self-pay

## 2021-04-30 ENCOUNTER — Telehealth: Payer: Self-pay | Admitting: Cardiovascular Disease

## 2021-04-30 NOTE — Telephone Encounter (Signed)
Please advise 

## 2021-04-30 NOTE — Telephone Encounter (Signed)
Pt is wanting to get a  live vaccine for immigration.. nurse is calling due to pts heart condition to see if this is safe... please advise

## 2021-04-30 NOTE — Telephone Encounter (Signed)
Per Dr. Elease Hashimoto: Pt may have any immunizations needed    LM for [facility] to call our office back.

## 2021-05-03 NOTE — Telephone Encounter (Signed)
Lm to call back ./cy 

## 2021-05-14 ENCOUNTER — Other Ambulatory Visit: Payer: Self-pay

## 2021-05-14 MED ORDER — CARVEDILOL 25 MG PO TABS: 25.0000 mg | ORAL_TABLET | Freq: Two times a day (BID) | ORAL | 0 refills | Status: DC

## 2021-05-14 NOTE — Telephone Encounter (Signed)
Pt's medication was sent to pt's pharmacy as requested. Confirmation received.  °

## 2021-05-17 ENCOUNTER — Other Ambulatory Visit: Payer: Self-pay

## 2021-05-18 ENCOUNTER — Ambulatory Visit (HOSPITAL_BASED_OUTPATIENT_CLINIC_OR_DEPARTMENT_OTHER): Payer: BC Managed Care – PPO | Admitting: Family

## 2021-06-05 ENCOUNTER — Other Ambulatory Visit: Payer: Self-pay | Admitting: Cardiovascular Disease

## 2021-06-07 ENCOUNTER — Other Ambulatory Visit: Payer: Self-pay

## 2021-06-08 ENCOUNTER — Ambulatory Visit (HOSPITAL_BASED_OUTPATIENT_CLINIC_OR_DEPARTMENT_OTHER): Payer: BC Managed Care – PPO | Admitting: Family

## 2021-06-28 ENCOUNTER — Other Ambulatory Visit: Payer: Self-pay

## 2021-06-28 ENCOUNTER — Encounter (HOSPITAL_BASED_OUTPATIENT_CLINIC_OR_DEPARTMENT_OTHER): Payer: Self-pay | Admitting: Urology

## 2021-06-28 ENCOUNTER — Emergency Department (HOSPITAL_BASED_OUTPATIENT_CLINIC_OR_DEPARTMENT_OTHER)
Admission: EM | Admit: 2021-06-28 | Discharge: 2021-06-28 | Disposition: A | Discharge status: Home / Self Care | Payer: Commercial Managed Care - PPO | Attending: Emergency Medicine | Admitting: Emergency Medicine

## 2021-06-28 ENCOUNTER — Emergency Department (HOSPITAL_BASED_OUTPATIENT_CLINIC_OR_DEPARTMENT_OTHER): Payer: Commercial Managed Care - PPO

## 2021-06-28 DIAGNOSIS — M79602 Pain in left arm: Secondary | ICD-10-CM | POA: Insufficient documentation

## 2021-06-28 DIAGNOSIS — R072 Precordial pain: Secondary | ICD-10-CM | POA: Diagnosis not present

## 2021-06-28 LAB — CBC WITH DIFFERENTIAL/PLATELET
Abs Immature Granulocytes: 0.03 10*3/uL (ref 0.00–0.07)
Basophils Absolute: 0 10*3/uL (ref 0.0–0.1)
Basophils Relative: 0 %
Eosinophils Absolute: 0.1 10*3/uL (ref 0.0–0.5)
Eosinophils Relative: 1 %
HCT: 44.1 % (ref 39.0–52.0)
Hemoglobin: 15.9 g/dL (ref 13.0–17.0)
Immature Granulocytes: 0 %
Lymphocytes Relative: 24 %
Lymphs Abs: 2.1 10*3/uL (ref 0.7–4.0)
MCH: 34.7 pg — ABNORMAL HIGH (ref 26.0–34.0)
MCHC: 36.1 g/dL — ABNORMAL HIGH (ref 30.0–36.0)
MCV: 96.3 fL (ref 80.0–100.0)
Monocytes Absolute: 0.6 10*3/uL (ref 0.1–1.0)
Monocytes Relative: 7 %
Neutro Abs: 6.1 10*3/uL (ref 1.7–7.7)
Neutrophils Relative %: 68 %
Platelets: 252 10*3/uL (ref 150–400)
RBC: 4.58 MIL/uL (ref 4.22–5.81)
RDW: 13.8 % (ref 11.5–15.5)
WBC: 8.9 10*3/uL (ref 4.0–10.5)
nRBC: 0 % (ref 0.0–0.2)

## 2021-06-28 LAB — COMPREHENSIVE METABOLIC PANEL
ALT: 45 U/L — ABNORMAL HIGH (ref 0–44)
AST: 66 U/L — ABNORMAL HIGH (ref 15–41)
Albumin: 4.3 g/dL (ref 3.5–5.0)
Alkaline Phosphatase: 81 U/L (ref 38–126)
Anion gap: 12 (ref 5–15)
BUN: 10 mg/dL (ref 6–20)
CO2: 24 mmol/L (ref 22–32)
Calcium: 9.6 mg/dL (ref 8.9–10.3)
Chloride: 99 mmol/L (ref 98–111)
Creatinine, Ser: 0.78 mg/dL (ref 0.61–1.24)
GFR, Estimated: >60 mL/min (ref >60)
Glucose, Bld: 128 mg/dL — ABNORMAL HIGH (ref 70–99)
Potassium: 3.9 mmol/L (ref 3.5–5.1)
Sodium: 135 mmol/L (ref 135–145)
Total Bilirubin: 1 mg/dL (ref 0.3–1.2)
Total Protein: 7.9 g/dL (ref 6.5–8.1)

## 2021-06-28 LAB — TROPONIN I (HIGH SENSITIVITY)
Troponin I (High Sensitivity): 6 ng/L (ref <18)
Troponin I (High Sensitivity): 5 ng/L (ref <18)

## 2021-06-28 NOTE — Discharge Instructions (Addendum)
You were seen in the [place] today with left arm pain.  Your heart enzymes, markers for heart attack, are normal.  Your EKG seems similar to prior tracings.  Please continue your home medications and follow closely with your primary care doctor.  Return with any new or suddenly worsening symptoms.

## 2021-06-28 NOTE — ED Triage Notes (Signed)
Pt states left arm pain that started today, denies chest discomfort States lots of stress and no sleep due to new baby H/o cardiomyopathy

## 2021-06-28 NOTE — ED Provider Notes (Signed)
Emergency Department Provider Note   I have reviewed the triage vital signs and the nursing notes.   HISTORY  Chief Complaint Left arm pain   HPI Patrick Santana is a 46 y.o. male with PMH reviewed below presents to the ED with left arm pain starting today. No pain radiating into the chest. No numbness/weakness. No neck pain. Notes a history of cardiomyopathy. No abdominal pain, nausea, or vomiting. No severe HA. No radiation of symptoms or modifying factors.    Review of Systems  Constitutional: No fever/chills Eyes: No visual changes. ENT: No sore throat. Cardiovascular: Denies chest pain. Respiratory: Denies shortness of breath. Gastrointestinal: No abdominal pain.  No nausea, no vomiting.  No diarrhea.  No constipation. Genitourinary: Negative for dysuria. Musculoskeletal: Negative for back pain. Positive left arm pain.  Skin: Negative for rash. Neurological: Negative for headaches, focal weakness or numbness.  10-point ROS otherwise negative.  ____________________________________________   PHYSICAL EXAM:  VITAL SIGNS: ED Triage Vitals  Enc Vitals Group     BP 06/28/21 1714 (!) 178/130     Pulse Rate 06/28/21 1714 84     Resp 06/28/21 1714 18     Temp 06/28/21 1714 98 F (36.7 C)     Temp Source 06/28/21 1714 Oral     SpO2 06/28/21 1714 95 %     Weight 06/28/21 1713 165 lb 5.5 oz (75 kg)     Height 06/28/21 1713 5\' 10"  (1.778 m)   Constitutional: Alert and oriented. Well appearing and in no acute distress. Eyes: Conjunctivae are normal. Head: Atraumatic. Nose: No congestion/rhinnorhea. Mouth/Throat: Mucous membranes are moist.   Neck: No stridor.   Cardiovascular: Normal rate, regular rhythm. Good peripheral circulation. Grossly normal heart sounds.   Respiratory: Normal respiratory effort.  No retractions. Lungs CTAB. Gastrointestinal: Soft and nontender. No distention.  Musculoskeletal: No lower extremity tenderness nor edema. No gross  deformities of extremities. Neurologic:  Normal speech and language. No gross focal neurologic deficits are appreciated. 5/5 strength and sensation in the bilateral upper/lower extremities.  Skin:  Skin is warm, dry and intact. No rash noted.   ____________________________________________   LABS (all labs ordered are listed, but only abnormal results are displayed)  Labs Reviewed  COMPREHENSIVE METABOLIC PANEL - Abnormal; Notable for the following components:      Result Value   Glucose, Bld 128 (*)    AST 66 (*)    ALT 45 (*)    All other components within normal limits  CBC WITH DIFFERENTIAL/PLATELET - Abnormal; Notable for the following components:   MCH 34.7 (*)    MCHC 36.1 (*)    All other components within normal limits  TROPONIN I (HIGH SENSITIVITY)  TROPONIN I (HIGH SENSITIVITY)   ____________________________________________  EKG   EKG Interpretation  Date/Time:  Monday June 28 2021 17:17:00 EST Ventricular Rate:  78 PR Interval:  172 QRS Duration: 86 QT Interval:  384 QTC Calculation: 437 R Axis:   66 Text Interpretation: Normal sinus rhythm Possible Left atrial enlargement Minimal voltage criteria for LVH, may be normal variant ( Sokolow-Lyon ) ST & Marked T wave abnormality, consider anterolateral ischemia Abnormal ECG When compared with ECG of 27-Jun-2018 23:32, PREVIOUS ECG IS PRESENTsimilar to prior Confirmed by 29-Jun-2018 [phone]) on 06/28/2021 5:25:19 PM        ____________________________________________  RADIOLOGY  CXR interpreted by me. No PNA.   ____________________________________________   PROCEDURES  Procedure(s) performed:   Procedures  None ____________________________________________   INITIAL IMPRESSION /  ASSESSMENT AND PLAN / ED COURSE  Pertinent labs & imaging results that were available during my care of the patient were reviewed by me and considered in my medical decision making (see chart for details).   Medical  Decision Making: Summary:  Patient with history of cardiomyopathy presents to the ED with left arm pain. EKG interpreted as above.    This patient is Presenting for Evaluation of left arm pain, which does require a range of treatment options, and is a complaint that involves a high risk of morbidity and mortality.  The Differential Diagnoses Differential includes all life-threatening causes for chest pain. This includes but is not exclusive to acute coronary syndrome, aortic dissection, pulmonary embolism, cardiac tamponade, community-acquired pneumonia, pericarditis, musculoskeletal chest wall pain, etc.  I decided to review pertinent External Data, and in summary patient's EKG similar to prior. Prior ECHO reviewed. .   Clinical Laboratory Tests Ordered, included CBC, Metabolic panel, and serial troponins . Review indicates negative trop x 2. No AKI. Mild LFT elevation. Normal bilirubin.  Radiologic Tests Ordered, included CXR.  I independently Visualized: CXR Images, which show PNA, effusion, PNX   Cardiac Monitor Tracing which shows NSR.    At this time, I do not feel there is any life-threatening condition present. I have reviewed and discussed all results (EKG, imaging, lab, urine as appropriate), exam findings with patient. I have reviewed nursing notes and appropriate previous records.  I feel the patient is safe to be discharged home without further emergent workup. Discussed usual and customary return precautions. Patient and family (if present) verbalize understanding and are comfortable with this plan.  Patient will follow-up with their primary care provider. If they do not have a primary care provider, information for follow-up has been provided to them. All questions have been answered.     ____________________________________________  FINAL CLINICAL IMPRESSION(S) / ED DIAGNOSES  Final diagnoses:  Precordial chest pain      Note:  This document was prepared using Dragon  voice recognition software and may include unintentional dictation errors.  Alona Bene, MD, [facility] Emergency Medicine    Celester Morgan, Arlyss Repress, MD 07/02/21 2108

## 2021-07-07 ENCOUNTER — Other Ambulatory Visit: Payer: Self-pay | Admitting: Cardiovascular Disease

## 2021-07-19 ENCOUNTER — Other Ambulatory Visit: Payer: Self-pay

## 2021-07-20 ENCOUNTER — Ambulatory Visit: Payer: BC Managed Care – PPO | Admitting: Cardiovascular Disease

## 2021-08-12 ENCOUNTER — Other Ambulatory Visit: Payer: Self-pay | Admitting: Cardiovascular Disease

## 2021-08-20 ENCOUNTER — Other Ambulatory Visit: Payer: Self-pay

## 2021-08-23 ENCOUNTER — Ambulatory Visit: Payer: Commercial Managed Care - PPO | Admitting: Cardiovascular Disease

## 2021-09-10 ENCOUNTER — Other Ambulatory Visit: Payer: Self-pay | Admitting: Cardiovascular Disease

## 2021-10-08 ENCOUNTER — Other Ambulatory Visit: Payer: Self-pay

## 2021-10-11 ENCOUNTER — Ambulatory Visit: Payer: Commercial Managed Care - PPO | Admitting: Cardiovascular Disease

## 2021-10-14 ENCOUNTER — Other Ambulatory Visit: Payer: Self-pay | Admitting: Cardiovascular Disease

## 2021-10-25 ENCOUNTER — Other Ambulatory Visit: Payer: Self-pay | Admitting: Cardiovascular Disease

## 2021-10-25 DIAGNOSIS — Z79899 Other long term (current) drug therapy: Secondary | ICD-10-CM

## 2021-10-25 DIAGNOSIS — I422 Other hypertrophic cardiomyopathy: Secondary | ICD-10-CM

## 2021-10-25 NOTE — Progress Notes (Signed)
July 27, 2020 ?Loa Socks, LPN ?  ?1:01 AM ?Note ?Spoke with the pt.  He will come in for lab appt on 09/17/20 to check a BMET, and see Dr. Elease Hashimoto as scheduled for 3/28, as advised by Dr. Elease Hashimoto. ?Pt verbalized understanding and agrees with this plan.  ?  ? ?July 24, 2020 ?Nahser, Deloris Ping, MD ?to [facility] Triage   ?5:41 PM ?Note ?He will need a bmp. ?He may have it drawn several days prior to his office visit   ?  ?Pt cancelled 3/28 appt and never came in for BMET. Pt is scheduled to be seen on 10/29/21 by Dr Elease Hashimoto, will update order at this time so he can get this done prior to appt. ?

## 2021-10-28 ENCOUNTER — Other Ambulatory Visit: Payer: Self-pay

## 2021-10-29 ENCOUNTER — Ambulatory Visit: Payer: Commercial Managed Care - PPO | Admitting: Cardiovascular Disease

## 2021-11-24 ENCOUNTER — Other Ambulatory Visit: Payer: Self-pay | Admitting: Cardiovascular Disease

## 2021-11-25 NOTE — Telephone Encounter (Signed)
Pt has cancelled appointments on numerous dates and pt has another appt on 12/30/21. Pt has not been seen since 2021. Would Dr. Elease Hashimoto still like to refill pt's medication or refer pt to PCP? Please address

## 2021-11-29 MED ORDER — CARVEDILOL 25 MG PO TABS: 25.0000 mg | ORAL_TABLET | Freq: Two times a day (BID) | ORAL | 0 refills | Status: DC

## 2021-12-29 ENCOUNTER — Other Ambulatory Visit: Payer: Commercial Managed Care - PPO

## 2021-12-30 ENCOUNTER — Ambulatory Visit: Payer: Commercial Managed Care - PPO | Admitting: Cardiovascular Disease

## 2022-02-13 ENCOUNTER — Encounter: Payer: Self-pay | Admitting: Cardiovascular Disease

## 2022-02-13 NOTE — Progress Notes (Signed)
This encounter was created in error - please disregard.

## 2022-02-14 ENCOUNTER — Other Ambulatory Visit: Payer: Commercial Managed Care - PPO

## 2022-02-15 ENCOUNTER — Encounter: Payer: Commercial Managed Care - PPO | Admitting: Cardiovascular Disease

## 2022-02-25 ENCOUNTER — Other Ambulatory Visit: Payer: Self-pay | Admitting: Cardiovascular Disease

## 2022-03-30 ENCOUNTER — Other Ambulatory Visit: Payer: Commercial Managed Care - PPO

## 2022-03-31 ENCOUNTER — Other Ambulatory Visit: Payer: Commercial Managed Care - PPO

## 2022-04-04 ENCOUNTER — Ambulatory Visit: Payer: Commercial Managed Care - PPO | Admitting: Cardiovascular Disease

## 2022-05-15 IMAGING — DX DG CHEST 1V PORT
1 series · 1 of 1 positions shown · non-contrast
Comparison: 06/27/2018

CLINICAL DATA: Left arm pain for 1 day, history of cardiomyopathy

EXAM:
PORTABLE CHEST 1 VIEW

[chest ap]
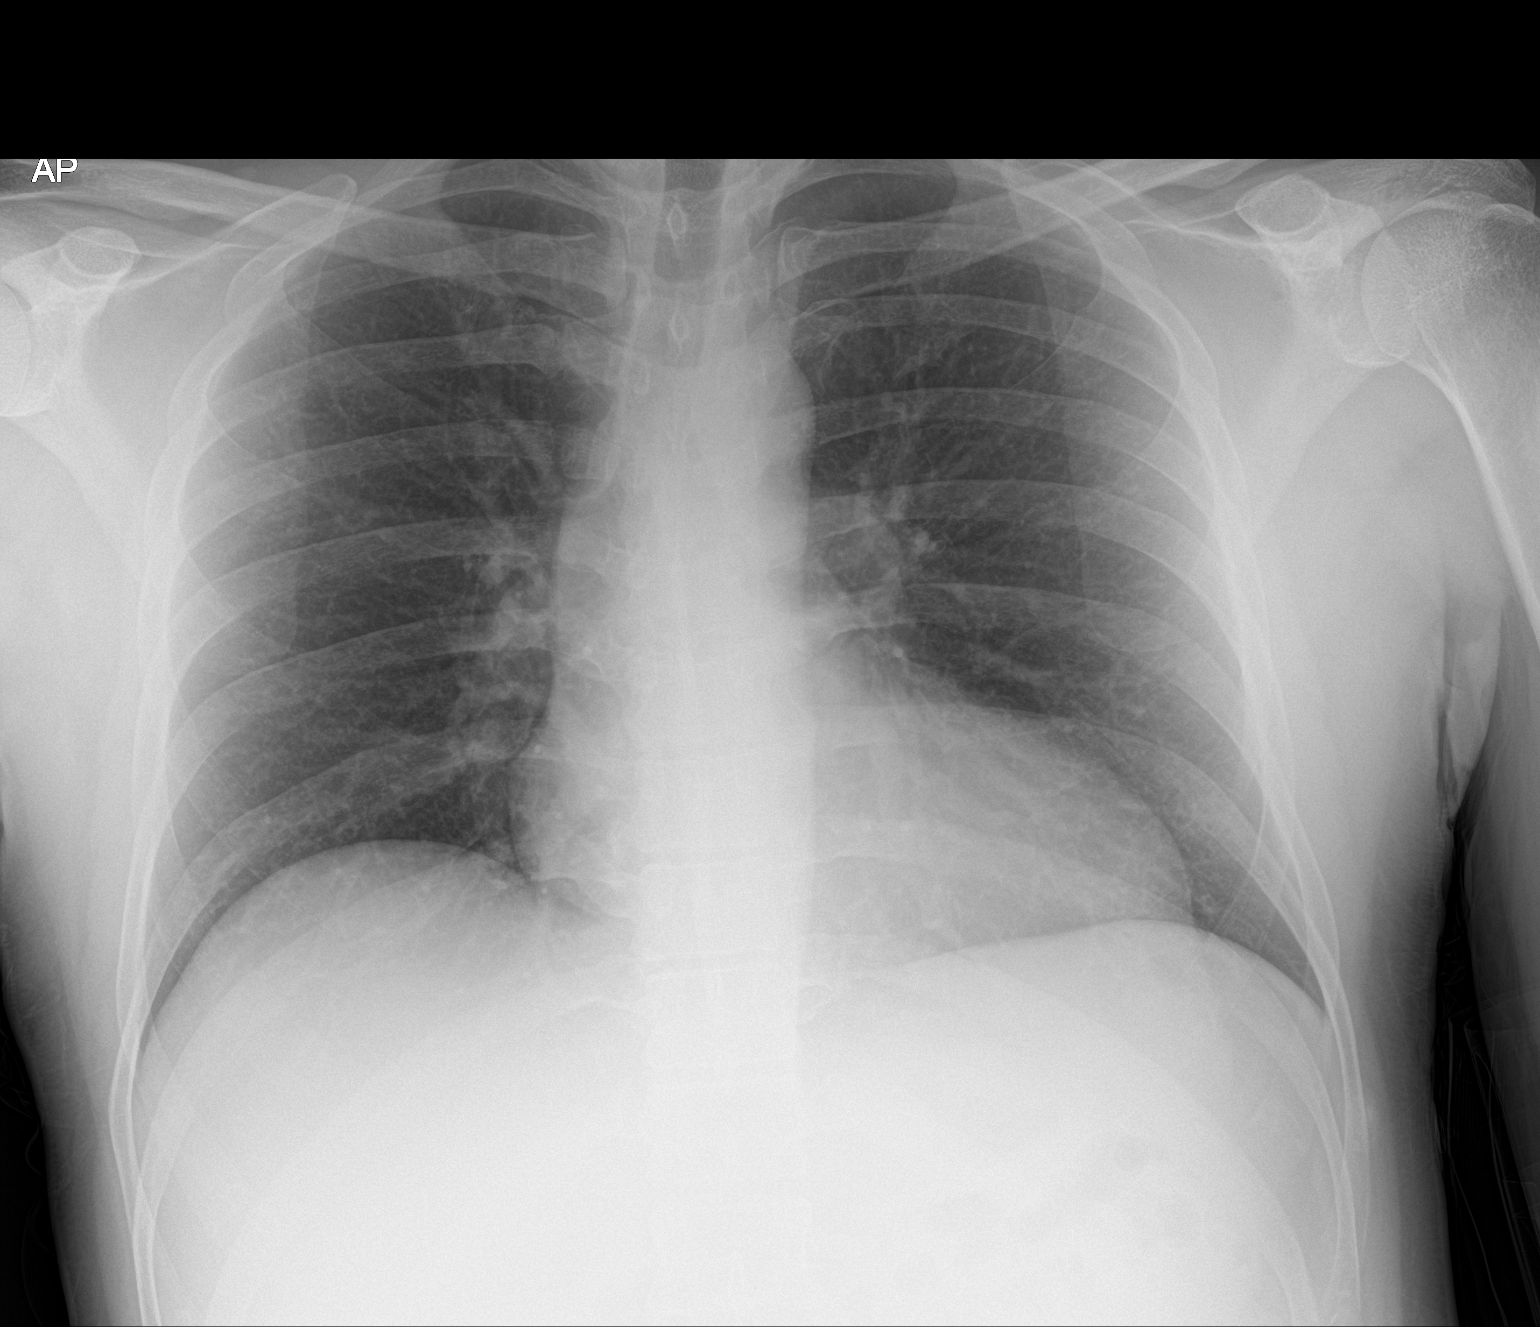

[1 of 1 positions shown; findings below may reference images not displayed]

FINDINGS: Single frontal view of the chest demonstrates stable borderline
enlargement the cardiac silhouette. No acute airspace disease,
effusion, or pneumothorax. No acute bony abnormalities.
IMPRESSION: 1. Stable chest, no acute process.

## 2022-05-17 ENCOUNTER — Ambulatory Visit: Payer: Commercial Managed Care - PPO | Admitting: Cardiovascular Disease

## 2022-06-02 ENCOUNTER — Other Ambulatory Visit: Payer: Self-pay | Admitting: Cardiovascular Disease

## 2022-07-07 ENCOUNTER — Other Ambulatory Visit: Payer: Self-pay | Admitting: Cardiovascular Disease

## 2022-07-15 ENCOUNTER — Ambulatory Visit: Payer: Commercial Managed Care - PPO | Admitting: Cardiovascular Disease

## 2022-08-19 ENCOUNTER — Ambulatory Visit: Payer: Commercial Managed Care - PPO | Admitting: Cardiovascular Disease

## 2022-08-26 ENCOUNTER — Other Ambulatory Visit: Payer: Self-pay | Admitting: Cardiovascular Disease

## 2022-10-18 ENCOUNTER — Ambulatory Visit: Payer: Commercial Managed Care - PPO | Admitting: Cardiovascular Disease

## 2022-11-29 ENCOUNTER — Other Ambulatory Visit: Payer: Self-pay | Admitting: Cardiovascular Disease

## 2023-01-03 ENCOUNTER — Other Ambulatory Visit: Payer: Self-pay | Admitting: Cardiovascular Disease

## 2023-01-10 ENCOUNTER — Ambulatory Visit: Payer: Commercial Managed Care - PPO | Admitting: Cardiovascular Disease

## 2023-02-07 ENCOUNTER — Other Ambulatory Visit: Payer: Self-pay | Admitting: Cardiovascular Disease

## 2023-03-15 ENCOUNTER — Other Ambulatory Visit: Payer: Self-pay | Admitting: Cardiovascular Disease

## 2023-04-03 ENCOUNTER — Ambulatory Visit: Payer: Commercial Managed Care - PPO | Admitting: Cardiovascular Disease

## 2023-07-09 ENCOUNTER — Encounter: Payer: Self-pay | Admitting: Cardiovascular Disease

## 2023-07-09 NOTE — Progress Notes (Signed)
Pt cancelled  This encounter was created in error - please disregard. 

## 2023-07-11 ENCOUNTER — Ambulatory Visit: Payer: Commercial Managed Care - PPO | Admitting: Cardiovascular Disease

## 2023-09-08 ENCOUNTER — Ambulatory Visit: Payer: Commercial Managed Care - PPO | Admitting: Cardiovascular Disease

## 2023-11-06 ENCOUNTER — Ambulatory Visit: Admitting: Cardiovascular Disease

## 2023-11-14 ENCOUNTER — Telehealth: Payer: Self-pay | Admitting: Cardiovascular Disease

## 2023-11-14 NOTE — Telephone Encounter (Signed)
 Pt of Dr. Alroy Aspen. Last OV was 09/14/19 by Mohammed Andrew NP. Last OV with Dr. Alroy Aspen was on 03/29/19. He does have an appt with Marlyse Single PA on 01/02/24. Please advise.

## 2023-11-14 NOTE — Telephone Encounter (Signed)
*  STAT* If patient is at the pharmacy, call can be transferred to refill team.   1. Which medications need to be refilled? (please list name of each medication and dose if known)   carvedilol  (COREG ) 25 MG tablet   2. Which pharmacy/location (including street and city if local pharmacy) is medication to be sent to? [id number] [place], [place] - [address] Phone: [phone]  Fax: [phone]     3. Do they need a 30 day or 90 day supply? 90

## 2023-11-15 ENCOUNTER — Other Ambulatory Visit: Payer: Self-pay | Admitting: Cardiovascular Disease

## 2023-11-15 NOTE — Telephone Encounter (Signed)
 Pt has cancelled his last 8 appointments. We will not refill until pt seen at office. He will need to seek from PCP in meantime.

## 2023-12-13 ENCOUNTER — Other Ambulatory Visit: Payer: Self-pay | Admitting: Cardiovascular Disease

## 2024-01-01 NOTE — Progress Notes (Deleted)
    OFFICE NOTE:    Date:  01/01/2024  ID:  Patrick Santana, DOB 04/21/1976, MRN [id number] PCP: Cleotilde Planas, MD  [facility] Providers Cardiologist:  Aleene Passe, MD { Click to update primary MD,subspecialty MD or APP then REFRESH:1}       Apical variant HCM TTE 01/30/18: Moderate concentric LVH of the apex-consistent with apical variant hypertrophic cardiomyopathy, EF 65-70, normal wall motion, grade 1 diastolic dysfunction, trivial AI  CMR 01/31/18: EF 69, no RWMA, no LGE, apical LVH c/w apical variant HCM ETT 03/2018: No exercise induced arrhythmias  Monitor 05/2018: NSR, no VT Genetics: no pathologic varian (11/2018) Hypertension  Hyperlipidemia        Discussed the use of AI scribe software for clinical note transcription with the patient, who gave verbal consent to proceed. History of Present Illness Patrick Santana is a 48 y.o. male who returns for follow up of apical variant HCM. He was last seen in 08/2019.     ROS-See HPI***    Studies Reviewed:      *** Results  Risk Assessment/Calculations: {Does this patient have ATRIAL FIBRILLATION?:[id number]} No BP recorded.  {Refresh Note OR Click here to enter BP  :1}***      Physical Exam:  VS:  There were no vitals taken for this visit.       Wt Readings from Last 3 Encounters:  06/28/21 165 lb 5.5 oz (75 kg)  09/17/19 158 lb 12.8 oz (72 kg)  03/29/19 166 lb 12.8 oz (75.7 kg)    Physical Exam***     Assessment and Plan:    Assessment & Plan Apical variant hypertrophic cardiomyopathy (HCC)  Essential hypertension  Assessment and Plan Assessment & Plan    {   Apical variant hypertrophic cardiomyopathy (HCC) As noted, cardiac MRI without high risk features.  Continue beta-blocker therapy.  I will arrange an exercise tolerance test for risk stratification.  I will review further with Dr. Passe whether or not to refer to genetic counseling.   Essential hypertension The patient's blood  pressure is controlled on his current regimen.  Continue current therapy.    Hyperlipidemia, unspecified hyperlipidemia type Continue statin.  Arrange follow-up lipids and LFTs.   :1}    {Are you ordering a CV Procedure (e.g. stress test, cath, DCCV, TEE, etc)?   Press F2        :[id number]}  Dispo:  No follow-ups on file.  Signed, Glendia Ferrier, PA-C

## 2024-01-02 ENCOUNTER — Ambulatory Visit: Admitting: Physician Assistant

## 2024-03-07 ENCOUNTER — Ambulatory Visit: Admitting: Student in an Organized Health Care Education/Training Program

## 2024-05-03 ENCOUNTER — Ambulatory Visit: Admitting: Student in an Organized Health Care Education/Training Program

## 2024-07-10 ENCOUNTER — Ambulatory Visit: Admitting: Student in an Organized Health Care Education/Training Program

## 2024-09-09 ENCOUNTER — Ambulatory Visit: Admitting: Student in an Organized Health Care Education/Training Program
